# Patient Record
Sex: Male | Born: 1940 | Race: White | Hispanic: No | Marital: Married | Smoking: Never smoker
Health system: Southern US, Community
[De-identification: ages and names within clinical notes are randomized; demographics above are authoritative.]

## PROBLEM LIST (undated history)

## (undated) DIAGNOSIS — N400 Enlarged prostate without lower urinary tract symptoms: Secondary | ICD-10-CM

## (undated) DIAGNOSIS — E039 Hypothyroidism, unspecified: Secondary | ICD-10-CM

## (undated) DIAGNOSIS — I259 Chronic ischemic heart disease, unspecified: Secondary | ICD-10-CM

## (undated) DIAGNOSIS — E538 Deficiency of other specified B group vitamins: Secondary | ICD-10-CM

## (undated) DIAGNOSIS — M810 Age-related osteoporosis without current pathological fracture: Secondary | ICD-10-CM

## (undated) DIAGNOSIS — E785 Hyperlipidemia, unspecified: Secondary | ICD-10-CM

## (undated) DIAGNOSIS — M1990 Unspecified osteoarthritis, unspecified site: Secondary | ICD-10-CM

## (undated) DIAGNOSIS — R55 Syncope and collapse: Secondary | ICD-10-CM

## (undated) DIAGNOSIS — I2510 Atherosclerotic heart disease of native coronary artery without angina pectoris: Secondary | ICD-10-CM

## (undated) DIAGNOSIS — E559 Vitamin D deficiency, unspecified: Secondary | ICD-10-CM

## (undated) HISTORY — PX: CATARACT EXTRACTION, BILATERAL: SHX1313

## (undated) HISTORY — DX: Deficiency of other specified B group vitamins: E53.8

## (undated) HISTORY — DX: Syncope and collapse: R55

## (undated) HISTORY — PX: WISDOM TOOTH EXTRACTION: SHX21

## (undated) HISTORY — DX: Vitamin D deficiency, unspecified: E55.9

## (undated) HISTORY — PX: LEG SURGERY: SHX1003

## (undated) HISTORY — DX: Hyperlipidemia, unspecified: E78.5

## (undated) HISTORY — PX: CARDIAC CATHETERIZATION: SHX172

## (undated) HISTORY — DX: Benign prostatic hyperplasia without lower urinary tract symptoms: N40.0

## (undated) HISTORY — DX: Unspecified osteoarthritis, unspecified site: M19.90

## (undated) HISTORY — DX: Chronic ischemic heart disease, unspecified: I25.9

## (undated) HISTORY — DX: Hypothyroidism, unspecified: E03.9

## (undated) HISTORY — PX: TONSILLECTOMY: SUR1361

---

## 2008-05-22 DIAGNOSIS — J301 Allergic rhinitis due to pollen: Secondary | ICD-10-CM | POA: Insufficient documentation

## 2008-05-22 DIAGNOSIS — E538 Deficiency of other specified B group vitamins: Secondary | ICD-10-CM | POA: Insufficient documentation

## 2011-04-08 DIAGNOSIS — H04129 Dry eye syndrome of unspecified lacrimal gland: Secondary | ICD-10-CM | POA: Insufficient documentation

## 2016-06-08 DIAGNOSIS — I10 Essential (primary) hypertension: Secondary | ICD-10-CM | POA: Insufficient documentation

## 2016-06-09 ENCOUNTER — Encounter: Payer: Self-pay | Admitting: Neurology

## 2016-06-10 ENCOUNTER — Encounter: Payer: Self-pay | Admitting: Neurology

## 2016-07-22 ENCOUNTER — Encounter: Payer: Self-pay | Admitting: Neurology

## 2016-07-23 NOTE — Progress Notes (Signed)
Colin Barker was seen today in the movement disorders clinic for neurologic consultation at the request of GARLICK,WILLIAM, MD.  The consultation is for the evaluation of tremor.  This patient is accompanied in the office by his wife who supplements the history.   Tremor: Yes.     How long has it been going on? 2 years, getting worse  At rest or with activation?  With use  Fam hx of tremor?  Yes.  , sisters (2) and brother have tremor  Located where?  Started in the L hand but is L hand dominant but has in the right now  Affected by caffeine:  No. (diet coke - 3 cans/day; unsweet tea with meal - 2 glasses)  Affected by alcohol:  No. (3-4 beers/week; used to be heavy drinker but stopped that 3-4 years ago)  Affected by stress:  No.  Affected by fatigue:  Yes.    Spills soup if on spoon:  yes  Spills glass of liquid if full:  Yes.    Affects ADL's (tying shoes, brushing teeth, etc):  Yes.   (has to brush teeth and use razor with both hands   Specific Symptoms:  Voice: no change Sleep: some intermittent trouble staying asleep  Vivid Dreams:  No.  Acting out dreams:  No. Wet Pillows: No. Postural symptoms:  Yes.  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  No. (none in recent years) Bradykinesia symptoms: difficulty getting out of a chair (has to push off) Loss of smell:  Yes.   Loss of taste:  No. Urinary Incontinence:  No. Difficulty Swallowing:  No. Handwriting, micrographia: No. Depression:  No. Memory changes:  No. visual distortions: Yes.   but rarely N/V:  No. Lightheaded:  No.  Syncope: No. Diplopia:  No. Dyskinesia:  No.  Neuroimaging has not previously been performed.    PREVIOUS MEDICATIONS: metoprolol 75 mg started one year ago but no help with tremor.  Started on it by cardiology but for tremor per patient  ALLERGIES:   Allergies  Allergen Reactions  .  Rosuvastatin     Other reaction(s): MUSCLE PAIN  . Statins     CURRENT MEDICATIONS:  Outpatient Encounter Prescriptions as of 07/28/2016  Medication Sig  . acetaminophen (TYLENOL) 500 MG tablet Take 500 mg by mouth every 6 (six) hours as needed.  Marland Kitchen aspirin EC 81 MG tablet Take 81 mg by mouth daily.  . cholecalciferol (VITAMIN D) 1000 units tablet Take 2,000 Units by mouth daily.   . clopidogrel (PLAVIX) 75 MG tablet Take 75 mg by mouth daily.  . isosorbide mononitrate (IMDUR) 30 MG 24 hr tablet Take 30 mg by mouth daily.  Marland Kitchen levothyroxine (SYNTHROID, LEVOTHROID) 100 MCG tablet Take 100 mcg by mouth daily before breakfast.  . metoprolol succinate (TOPROL-XL) 25 MG 24 hr tablet Take 25 mg by mouth daily.  . simvastatin (ZOCOR) 40 MG tablet Take 40 mg by mouth daily.  . vitamin B-12 (CYANOCOBALAMIN) 1000 MCG tablet Take 1,000 mcg by mouth daily.  . [DISCONTINUED] ibuprofen (ADVIL,MOTRIN) 800 MG tablet Take 800 mg by mouth every 8 (eight) hours as needed.  . [DISCONTINUED] HYDROcodone-acetaminophen (NORCO) 7.5-325 MG tablet Take 1 tablet by mouth every 6 (six) hours as needed for moderate pain.   No facility-administered encounter medications on file as of 07/28/2016.     PAST MEDICAL HISTORY:  Past Medical History:  Diagnosis Date  . BPH (benign prostatic hyperplasia)   . Chronic ischemic heart disease   . Hyperlipidemia   . Hypothyroidism   . Osteoarthritis   . Syncope   . Vitamin B12 deficiency    on injections  . Vitamin D deficiency     PAST SURGICAL HISTORY:   Past Surgical History:  Procedure Laterality Date  . CATARACT EXTRACTION, BILATERAL    . LEG SURGERY Right    x3    SOCIAL HISTORY:   Social History   Social History  . Marital status: Married    Spouse name: N/A  . Number of children: N/A  . Years of education: N/A   Occupational History  . retired     Stage manager   Social History Main Topics  . Smoking  status: Never Smoker  . Smokeless tobacco: Current User    Types: Chew  . Alcohol use Yes     Comment: 3-4 a week (hx of heavier use)  . Drug use: No  . Sexual activity: Not on file   Other Topics Concern  . Not on file   Social History Narrative  . No narrative on file    FAMILY HISTORY:   Family Status  Relation Status  . Mother Deceased  . Father Deceased  . Sister Alive  . Brother Alive  . Child Alive  . Sister Deceased    ROS:  Admits to neck and low back pain.  Was told in the past that he has compression fractures in the low back, but not a candidate for surgery because of osteopenia.  A complete 10 system review of systems was obtained and was unremarkable apart from what is mentioned above.  PHYSICAL EXAMINATION:    VITALS:   Vitals:   07/28/16 0935  BP: 138/84  Pulse: (!) 52  Weight: 260 lb (117.9 kg)  Height: 6' (1.829 m)    GEN:  The patient appears stated age and is in NAD. HEENT:  Normocephalic, atraumatic.  The mucous membranes are moist. The superficial temporal arteries are without ropiness or tenderness. CV:  Bradycardic.  Regular. Lungs:  CTAB Neck/HEME:  There are no carotid bruits bilaterally.  Neurological examination:  Orientation: The patient is alert and oriented x3. Fund of knowledge is appropriate.  Recent and remote memory are intact.  Attention and concentration are normal.    Able to name objects and repeat phrases. Cranial nerves: There is good facial symmetry. Pupils are equal round and reactive to light bilaterally. Fundoscopic exam reveals clear margins bilaterally. Extraocular muscles are intact. The visual fields are full to confrontational testing. The speech is fluent and clear. Soft palate rises symmetrically and there is no tongue deviation. Hearing is intact to conversational tone. Sensation: Sensation is intact to light and pinprick throughout (facial, trunk, extremities). Vibration is decreased distially. There is no  extinction with double simultaneous stimulation. There is no sensory dermatomal level identified. Motor: Strength is 5/5 in the bilateral upper and lower extremities.   Shoulder shrug is equal and symmetric.  There is no pronator drift. Deep tendon reflexes: Deep tendon reflexes are 3+/4 at the bilateral biceps, triceps, brachioradialis, patella (With cross adductor reflexes) and 1/4 at the bilateral achilles.  He has pectoralis reflexes bilaterally.  Plantar responses are downgoing bilaterally.  Movement examination: Tone: There is mild increased tone in the RUE.  Abnormal movements: There is a RUE resting tremor more than LUE resting tremor but  both are present.  He has mild tremor of outstretched hands that only slightly increase with intention.  When given a weight in the hand, it did not change tremor significantly.  When asked to draw Archimedes spirals, he does have difficulty getting the pen on the paper  but then is able to draw the spirals with evidence of tremor.  Tremor is evident when asked to pour water from one glass to another, but he does not really spill the water. Coordination:  There is decremation with RAM's, with any form of RAMS, including alternating supination and pronation of the forearm, hand opening and closing, finger taps, heel taps and toe taps on the right.   Gait and Station: The patient has difficulty arising out of a deep-seated chair without the use of the hands.  He is able to do it on the second attempt, although he almost falls back into the chair.  The patient's stride length is normal with no evidence of re-emergent tremor.  He is stooped.  He is just mildly slow.  He is antalgic because of a short leg from previous surgeries.  Labs: Patient had lab work on 06/30/2016.  I reviewed those.  His white blood cells were 5.6, hemoglobin 13.2, hematocrit 39.2 and platelets 175.  Sodium was 142, potassium 4.3, chloride 103, CO2 21, BUN 20, creatinine 1.04 and glucose 100.   AST 18, ALT 14 and alkaline phosphatase 57.  TSH was 2.770.  ASSESSMENT/PLAN:  1.  Tremor  -The patient does have some features on his examination that are concerning for a parkinsonian syndrome, although his history sounds more consistent with essential tremor.  It is true that essential tremor can present asymmetrically and alternately cause rest tremor, but he has not had this all that long and I would be a little surprised to see rest tremor this early.  We will go ahead and schedule a DaT scan.  -Patient asked me if he could discontinue his metoprolol.  I told him that was certainly up to his prescribing physician, but if it was purely for tremor as he states it is, I have no objection to the discontinuation since he does not think it helps.  He is bradycardic, likely due to this medication.  2.  Neck pain with significant hyperreflexia.  -We will proceed with an MRI of the brain and cervical spine.  3.  I will follow-up with him after the above have been completed.  Greater than 50% of the 45 minute visit was in counseling/coordinating care.    CcNicolasa Ducking:  GARLICK,WILLIAM, MD

## 2016-07-28 ENCOUNTER — Encounter: Payer: Self-pay | Admitting: Neurology

## 2016-07-28 ENCOUNTER — Ambulatory Visit (INDEPENDENT_AMBULATORY_CARE_PROVIDER_SITE_OTHER): Payer: Medicare HMO | Admitting: Neurology

## 2016-07-28 VITALS — BP 138/84 | HR 52 | Ht 72.0 in | Wt 260.0 lb

## 2016-07-28 DIAGNOSIS — R292 Abnormal reflex: Secondary | ICD-10-CM

## 2016-07-28 DIAGNOSIS — R251 Tremor, unspecified: Secondary | ICD-10-CM

## 2016-07-28 DIAGNOSIS — M542 Cervicalgia: Secondary | ICD-10-CM | POA: Diagnosis not present

## 2016-07-28 DIAGNOSIS — R2681 Unsteadiness on feet: Secondary | ICD-10-CM

## 2016-07-28 NOTE — Patient Instructions (Signed)
1. We will call you to schedule Dat Scan and MRI Brain/Cervical Spine once [facility] starts offering Dat Scanning (which should be soon).

## 2016-08-24 ENCOUNTER — Telehealth: Payer: Self-pay | Admitting: Neurology

## 2016-08-24 DIAGNOSIS — R251 Tremor, unspecified: Secondary | ICD-10-CM

## 2016-08-24 DIAGNOSIS — R292 Abnormal reflex: Secondary | ICD-10-CM

## 2016-08-24 DIAGNOSIS — M542 Cervicalgia: Secondary | ICD-10-CM

## 2016-08-24 NOTE — Telephone Encounter (Signed)
Caller: PT's Wife  Urgent? No  Reason for the call: She states that she has not heard anything about scheduling his scan

## 2016-08-25 NOTE — Telephone Encounter (Signed)
Spoke with patient's wife. Made aware DAT will be available at the end of May. They want to go ahead and schedule MR's while they are waiting. Order entered. [facility] will call them to schedule.

## 2016-09-02 ENCOUNTER — Telehealth: Payer: Self-pay | Admitting: Neurology

## 2016-09-02 NOTE — Telephone Encounter (Signed)
Left message on machine for patient's wife to call back.  To make her aware we did get denial on scans. Will need to appeal, so they should cancel MR's for now until we have time to get approval. Awaiting call back.

## 2016-09-02 NOTE — Telephone Encounter (Signed)
Caller: Patient's Wife Colin Mortimer(Wanda)   Urgent? Yes  Reason for the call: Colin Barker is scheduled for an MRI on Wednesday 09/09/16. She said insurance has denied it and she confirmed they do have [employer] and that [employer] needs more Clinical Information. Thanks

## 2016-09-02 NOTE — Telephone Encounter (Signed)
Received denial from [employer] Pouch([employer]) for patient to have MR Brain and MR Cervical Spine. We can appeal at phone number [phone] reference #[id number]. Please advise.

## 2016-09-02 NOTE — Telephone Encounter (Signed)
Patient's wife made aware.

## 2016-09-03 NOTE — Telephone Encounter (Signed)
Ive called this number along with several others they have directed me to and cannot get anyone to speak to me about this case/denial.

## 2016-09-07 ENCOUNTER — Telehealth: Payer: Self-pay | Admitting: Neurology

## 2016-09-07 NOTE — Telephone Encounter (Signed)
Spoke with patient's wife. Made her aware we are working on appealing decision.   Spoke with [employer] and they state either the MD or patient has to call to request the expedited appeal. [phone]. Then we can fax in clinicals to [phone]. Turn around time is 3 days. Regular appeal is 60 days.

## 2016-09-07 NOTE — Telephone Encounter (Signed)
Caller:  Aquan's wife Burna Mortimer(Wanda)   Urgent? Yes  Reason for the call: She is needing to know about the MRI that was cancelled and if they could just pay for it so he can have it done. She would like you to please call her. She was also asking if there was another location. Please call. Thanks

## 2016-09-07 NOTE — Telephone Encounter (Signed)
error 

## 2016-09-08 NOTE — Telephone Encounter (Signed)
Patient's wife made aware.

## 2016-09-09 ENCOUNTER — Other Ambulatory Visit: Payer: Medicare HMO

## 2016-09-09 ENCOUNTER — Encounter: Payer: Self-pay | Admitting: Neurology

## 2016-09-09 ENCOUNTER — Telehealth: Payer: Self-pay | Admitting: Neurology

## 2016-09-09 DIAGNOSIS — R251 Tremor, unspecified: Secondary | ICD-10-CM

## 2016-09-09 NOTE — Telephone Encounter (Signed)
[facility] and [facility]. [phone] with confirmation received.   Appeal letter also sent to [facility] at [phone] for clinical ref [id number]. MR Cervical Spine. Awaiting decision.

## 2016-09-09 NOTE — Telephone Encounter (Signed)
DAT Scan order entered. Message sent to April Pait to schedule. No auth needed for [id number] or [id number] per Alvino ChapelEllen at [employer].   LMOM making wife aware.

## 2016-09-09 NOTE — Telephone Encounter (Signed)
Caller: [employer]  Urgent?   Reason for the call: Prior authorization for PT/CB# [id number] OPT#4,2 [phone]#[id number]

## 2016-09-11 ENCOUNTER — Telehealth: Payer: Self-pay | Admitting: Neurology

## 2016-09-11 NOTE — Telephone Encounter (Signed)
Caller: Burna MortimerWanda  Urgent? No  Reason for the call: VM-Left a message to have a call back from [place] and did not say why

## 2016-09-11 NOTE — Telephone Encounter (Signed)
She states they received a call [employer] approved MR. I let her know she could go ahead and [facility].

## 2016-09-15 ENCOUNTER — Encounter (HOSPITAL_COMMUNITY)
Admission: RE | Admit: 2016-09-15 | Discharge: 2016-09-15 | Disposition: A | Discharge status: Home / Self Care | Payer: Medicare HMO | Source: Ambulatory Visit | Attending: Neurology | Admitting: Neurology

## 2016-09-15 ENCOUNTER — Encounter (HOSPITAL_COMMUNITY): Payer: Medicare HMO

## 2016-09-15 DIAGNOSIS — R251 Tremor, unspecified: Secondary | ICD-10-CM | POA: Insufficient documentation

## 2016-09-15 MED ORDER — IOFLUPANE I 123 185 MBQ/2.5ML IV SOLN
4.5900 | Freq: Once | INTRAVENOUS | Status: AC
  Administered 2016-09-15: 4.59 via INTRAVENOUS

## 2016-09-15 NOTE — Progress Notes (Addendum)
Colin Barker was seen today in the movement disorders clinic for neurologic consultation at the request of Colin Barker, William, MD.  The consultation is for the evaluation of tremor.  This patient is accompanied in the office by his wife who supplements the history.   Tremor: Yes.     How long has it been going on? 2 years, getting worse  At rest or with activation?  With use  Fam hx of tremor?  Yes.  , sisters (2) and brother have tremor  Located where?  Started in the L hand but is L hand dominant but has in the right now  Affected by caffeine:  No. (diet coke - 3 cans/day; unsweet tea with meal - 2 glasses)  Affected by alcohol:  No. (3-4 beers/week; used to be heavy drinker but stopped that 3-4 years ago)  Affected by stress:  No.  Affected by fatigue:  Yes.    Spills soup if on spoon:  yes  Spills glass of liquid if full:  Yes.    Affects ADL's (tying shoes, brushing teeth, etc):  Yes.   (has to brush teeth and use razor with both hands   Specific Symptoms:  Voice: no change Sleep: some intermittent trouble staying asleep  Vivid Dreams:  No.  Acting out dreams:  No. Wet Pillows: No. Postural symptoms:  Yes.  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  No. (none in recent years) Bradykinesia symptoms: difficulty getting out of a chair (has to push off) Loss of smell:  Yes.   Loss of taste:  No. Urinary Incontinence:  No. Difficulty Swallowing:  No. Handwriting, micrographia: No. Depression:  No. Memory changes:  No. visual distortions: Yes.   but rarely N/V:  No. Lightheaded:  No.  Syncope: No. Diplopia:  No. Dyskinesia:  No.  09/17/16 update:  Patient seen today in follow-up.  He is accompanied by his wife who supplements the history.  I ordered an MRI of the brain and cervical spine since our last visit.  They were initially denied and had to have a written appeal.   MRI of the cervical spine is subsequently scheduled for 09/20/2016 and MRI of the brain is still being appealed (currently still denied by insurance).  DaT scan was done on 09/15/2016.  This demonstrated normal uptake of the radiotracer.  I personally reviewed the films.    PREVIOUS MEDICATIONS: metoprolol 75 mg started one year ago but no help with tremor.  Started on it by cardiology but for tremor per patient  ALLERGIES:   Allergies  Allergen Reactions  . Rosuvastatin     Other reaction(s): MUSCLE PAIN  . Statins     CURRENT MEDICATIONS:  Outpatient Encounter Prescriptions as of 09/17/2016  Medication Sig  . acetaminophen (TYLENOL) 500 MG tablet Take 500 mg by mouth every 6 (six) hours as needed.  Marland Kitchen. aspirin EC 81 MG tablet Take 81 mg by mouth daily.  . cholecalciferol (VITAMIN D) 1000 units tablet Take 2,000 Units by mouth daily.   . clopidogrel (PLAVIX) 75 MG tablet Take 75 mg by mouth daily.  . isosorbide mononitrate (IMDUR) 30 MG 24 hr tablet Take 30 mg by mouth daily.  Marland Kitchen. levothyroxine (SYNTHROID, LEVOTHROID) 100 MCG tablet Take 100 mcg by mouth daily before breakfast.  . simvastatin (ZOCOR) 40 MG tablet Take 40 mg by mouth daily.  . vitamin  B-12 (CYANOCOBALAMIN) 1000 MCG tablet Take 1,000 mcg by mouth daily.  . [DISCONTINUED] metoprolol succinate (TOPROL-XL) 25 MG 24 hr tablet Take 25 mg by mouth daily.   No facility-administered encounter medications on file as of 09/17/2016.     PAST MEDICAL HISTORY:   Past Medical History:  Diagnosis Date  . BPH (benign prostatic hyperplasia)   . Chronic ischemic heart disease   . Hyperlipidemia   . Hypothyroidism   . Osteoarthritis   . Syncope   . Vitamin B12 deficiency    on injections  . Vitamin D deficiency     PAST SURGICAL HISTORY:   Past Surgical History:  Procedure Laterality Date  . CATARACT EXTRACTION, BILATERAL    . LEG SURGERY Right    x3    SOCIAL HISTORY:   Social History   Social History  . Marital status:  Married    Spouse name: N/A  . Number of children: N/A  . Years of education: N/A   Occupational History  . retired     Stage manager   Social History Main Topics  . Smoking status: Never Smoker  . Smokeless tobacco: Current User    Types: Chew  . Alcohol use Yes     Comment: 3-4 a week (hx of heavier use)  . Drug use: No  . Sexual activity: Not on file   Other Topics Concern  . Not on file   Social History Narrative  . No narrative on file    FAMILY HISTORY:   Family Status  Relation Status  . Mother Deceased  . Father Deceased  . Sister Alive  . Brother Alive  . Child Alive  . Sister Deceased    ROS:  Admits to neck and low back pain.  Was told in the past that he has compression fractures in the low back, but not a candidate for surgery because of osteopenia.  A complete 10 system review of systems was obtained and was unremarkable apart from what is mentioned above.  PHYSICAL EXAMINATION:    VITALS:   Vitals:   09/17/16 1101  BP: 134/68  Pulse: 71  SpO2: 95%  Weight: 260 lb (117.9 kg)  Height: 6' (1.829 m)    GEN:  The patient appears stated age and is in NAD. HEENT:  Normocephalic, atraumatic.  The mucous membranes are moist. The superficial temporal arteries are without ropiness or tenderness. CV:  Bradycardic.  Regular. Lungs:  CTAB Neck/HEME:  There are no carotid bruits bilaterally.  Neurological examination:  Orientation: The patient is alert and oriented x3.  Cranial nerves: There is good facial symmetry.  The speech is fluent and clear. Soft palate rises symmetrically and there is no tongue deviation. Hearing is intact to conversational tone. Sensation: Sensation is intact to light and pinprick throughout (facial, trunk, extremities). Vibration is decreased distially. There is no extinction with double simultaneous stimulation. There is no sensory dermatomal level identified. Motor: Strength is 5/5 in  the bilateral upper and lower extremities.   Shoulder shrug is equal and symmetric.  There is no pronator drift. Deep tendon reflexes: Deep tendon reflexes are 3+/4 at the bilateral biceps, triceps, brachioradialis, patella (With cross adductor reflexes) and 1/4 at the bilateral achilles.  He has pectoralis reflexes bilaterally.  Plantar responses are downgoing bilaterally.  Movement examination: Tone: There is mild increased tone in the RUE.  Abnormal movements: no resting tremor with either hand today  He has mild tremor of outstretched hands  that only slightly increase with intention.  When given a weight in the hand, it did not change tremor significantly.  .  Tremor is evident when asked to pour water from one glass to another, but he does not really spill the water. Coordination:  There is decremation with RAM's, with any form of RAMS, including alternating supination and pronation of the forearm, hand opening and closing, finger taps, heel taps and toe taps on the right.   Gait and Station: The patient has mild difficulty arising out of a deep-seated chair without the use of the hands.  .  The patient's stride length is normal with no evidence of re-emergent tremor.  He is stooped.  He is just mildly slow.  He is antalgic because of a short leg from previous surgeries.  Labs: Patient had lab work on 06/30/2016.  I reviewed those.  His white blood cells were 5.6, hemoglobin 13.2, hematocrit 39.2 and platelets 175.  Sodium was 142, potassium 4.3, chloride 103, CO2 21, BUN 20, creatinine 1.04 and glucose 100.  AST 18, ALT 14 and alkaline phosphatase 57.  TSH was 2.770.  ASSESSMENT/PLAN:  1.  Tremor  -DaT scan was negative but he still has some rigidity in the RUE.  Hx is more c/w ET.  Pt/wife shows pics of the DaT scan today and it was reviewed with them.  -will start primidone - 50 mg.  Not a lot of tremor seen today but pt does state that it gets worse as day goes on.  Risks, benefits, side  effects and alternative therapies were discussed.  The opportunity to ask questions was given and they were answered to the best of my ability.  The patient expressed understanding and willingness to follow the outlined treatment protocols.  -insurance has denied MRI brain, which is frustrating given I would not expect ET to come on quite so fast and produce unilateral tremor, even though rest of hx is consisten with essential tremor.  We have appealed the MRI brain but at this point it is still denied.  Wife going to call insurance again which is how MRI cervical spine got approved  -Patient feels that d/c metoprolol actually made tremor better  2.  Neck pain with significant hyperreflexia.  -cervical spine MRI scheduled for 09/20/16  3.  [place] so will call him with cervical spine results and f/u with him in a few months.  Much greater than 50% of this visit was spent in counseling and coordinating care.  Total face to face time:  35 min     Cc:  Colin Ducking, MD

## 2016-09-17 ENCOUNTER — Ambulatory Visit (INDEPENDENT_AMBULATORY_CARE_PROVIDER_SITE_OTHER): Payer: Medicare HMO | Admitting: Neurology

## 2016-09-17 ENCOUNTER — Encounter: Payer: Self-pay | Admitting: Neurology

## 2016-09-17 VITALS — BP 134/68 | HR 71 | Ht 72.0 in | Wt 260.0 lb

## 2016-09-17 DIAGNOSIS — R251 Tremor, unspecified: Secondary | ICD-10-CM

## 2016-09-17 MED ORDER — PRIMIDONE 50 MG PO TABS: 50.0000 mg | ORAL_TABLET | Freq: Every day | ORAL | 1 refills | Status: DC

## 2016-09-17 MED ORDER — PRIMIDONE 50 MG PO TABS: 50.0000 mg | ORAL_TABLET | Freq: Every day | ORAL | 0 refills | Status: DC

## 2016-09-17 NOTE — Patient Instructions (Signed)
1.  Start primidone - 50 mg - 1/2 tablet at night for 4 nights and then increase to 1 tablet at night 2.  Let me know if you call insurance and they say that they still don't have order for the MRI brain. 3.  We will call you with MRI cervical spine results 4.  Make your follow up for 3-4 months! 5.  Good to see you!  Have a great summer!

## 2016-09-20 ENCOUNTER — Ambulatory Visit
Admission: RE | Admit: 2016-09-20 | Discharge: 2016-09-20 | Disposition: A | Discharge status: Home / Self Care | Payer: Medicare HMO | Source: Ambulatory Visit | Attending: Neurology | Admitting: Neurology

## 2016-09-20 DIAGNOSIS — M542 Cervicalgia: Secondary | ICD-10-CM

## 2016-09-20 DIAGNOSIS — R292 Abnormal reflex: Secondary | ICD-10-CM

## 2016-09-20 DIAGNOSIS — R251 Tremor, unspecified: Secondary | ICD-10-CM

## 2016-09-22 ENCOUNTER — Telehealth: Payer: Self-pay | Admitting: Neurology

## 2016-09-22 NOTE — Telephone Encounter (Signed)
Patient's wife made aware. Referral faxed to [facility] at [phone] with confirmation received. They will contact the patient to schedule.

## 2016-09-22 NOTE — Telephone Encounter (Signed)
Left message on machine for patient to call back.

## 2016-09-22 NOTE — Telephone Encounter (Signed)
-----   Message from Octaviano Battyebecca S Tat, DO sent at 09/22/2016  7:31 AM EDT ----- Reviewed.  Significant CCS at C3-4.  Marshaun Lortie, let patient know results.  I would recommend neurosx referral with Dr. Venetia MaxonStern

## 2016-09-24 ENCOUNTER — Telehealth: Payer: Self-pay | Admitting: Neurology

## 2016-09-24 MED ORDER — PRIMIDONE 50 MG PO TABS: 50.0000 mg | ORAL_TABLET | Freq: Every day | ORAL | 0 refills | Status: DC

## 2016-09-24 NOTE — Telephone Encounter (Signed)
Caller: Remer MachoWanda Haymaker (wife)  Urgent? No  Reason for the call: They will be going out of town for 2 months and he will need a 3 month supply of his Primidone.  He received a 30 day last time. She said she had her Insurance company to do  a [facility] [facility] [place]. Thanks

## 2016-09-24 NOTE — Telephone Encounter (Signed)
90 day supply sent to pharmacy

## 2016-11-05 ENCOUNTER — Telehealth: Payer: Self-pay | Admitting: Neurology

## 2016-11-05 NOTE — Telephone Encounter (Signed)
[facility]. To call with any questions.

## 2016-11-05 NOTE — Telephone Encounter (Signed)
Patient wife called and needs to talk to someone about medication concerns and possibly moving appt up

## 2016-11-05 NOTE — Telephone Encounter (Signed)
Spoke with patient's wife. She states that patient is still having trouble with tremors. Currently on Primidone 50 mg - 1 tablet at bedtime. No current side effects. He is doing a little better on medication, but not much.  He has cut out alcohol and almost all caffeine.  Next appt in September. Please advise.

## 2016-11-05 NOTE — Telephone Encounter (Signed)
Increase to 50 mg bid. 

## 2016-11-17 ENCOUNTER — Telehealth: Payer: Self-pay | Admitting: Neurology

## 2016-11-17 MED ORDER — PRIMIDONE 50 MG PO TABS: 50.0000 mg | ORAL_TABLET | Freq: Two times a day (BID) | ORAL | 0 refills | Status: DC

## 2016-11-17 NOTE — Telephone Encounter (Signed)
Caller: [place]  Urgent? No  Reason for the call: Regarding needing his Primidone 50 MG [facility]. He needs a 90 day supply. She said since he was increased to 2 pills his tremors have gotten a little better. Thanks

## 2016-11-17 NOTE — Telephone Encounter (Signed)
RX sent to pharmacy  

## 2016-12-07 ENCOUNTER — Other Ambulatory Visit: Payer: Self-pay | Admitting: Neurosurgery

## 2016-12-25 NOTE — Progress Notes (Signed)
Colin Barker was seen today in the movement disorders clinic for neurologic consultation at the request of Nicolasa Ducking, MD.  The consultation is for the evaluation of tremor.  This patient is accompanied in the office by his wife who supplements the history.   Tremor: Yes.     How long has it been going on? 2 years, getting worse  At rest or with activation?  With use  Fam hx of tremor?  Yes.  , sisters (2) and brother have tremor  Located where?  Started in the L hand but is L hand dominant but has in the right now  Affected by caffeine:  No. (diet coke - 3 cans/day; unsweet tea with meal - 2 glasses)  Affected by alcohol:  No. (3-4 beers/week; used to be heavy drinker but stopped that 3-4 years ago)  Affected by stress:  No.  Affected by fatigue:  Yes.    Spills soup if on spoon:  yes  Spills glass of liquid if full:  Yes.    Affects ADL's (tying shoes, brushing teeth, etc):  Yes.   (has to brush teeth and use razor with both hands   Specific Symptoms:  Voice: no change Sleep: some intermittent trouble staying asleep  Vivid Dreams:  No.  Acting out dreams:  No. Wet Pillows: No. Postural symptoms:  Yes.  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  No. (none in recent years) Bradykinesia symptoms: difficulty getting out of a chair (has to push off) Loss of smell:  Yes.   Loss of taste:  No. Urinary Incontinence:  No. Difficulty Swallowing:  No. Handwriting, micrographia: No. Depression:  No. Memory changes:  No. visual distortions: Yes.   but rarely N/V:  No. Lightheaded:  No.  Syncope: No. Diplopia:  No. Dyskinesia:  No.  09/17/16 update:  Patient seen today in follow-up.  He is accompanied by his wife who supplements the history.  I ordered an MRI of the brain and cervical spine since our last visit.  They were initially denied and had to have a written appeal.   MRI of the cervical spine is subsequently scheduled for 09/20/2016 and MRI of the brain is still being appealed (currently still denied by insurance).  DaT scan was done on 09/15/2016.  This demonstrated normal uptake of the radiotracer.  I personally reviewed the films.    12/29/16 update: Patient seen today in follow-up for essential tremor.  He was started on primidone last visit.  His wife called in July and stated that the medication was helping, but not enough.  The medication was increased to 50 mg, twice daily dosing.  The medication is helpful but he still has trouble shaving.  He has cut out alcohol and has backed down on coke.  He had an MRI of the cervical spine since our last visit.  I ordered this and reviewed it when completed.  There was disc protrusion at the C 3 C4 level with associated cord myelomalacia.  He is scheduled to have cervical spine surgery on 01/05/2017 with Dr. Venetia Maxon.  PREVIOUS MEDICATIONS: metoprolol 75 mg started one year ago but no help with tremor.  Started on it by cardiology but for tremor per patient  ALLERGIES:   Allergies  Allergen Reactions  . Rosuvastatin     Other reaction(s): MUSCLE PAIN    CURRENT MEDICATIONS:  Outpatient Encounter Prescriptions as of 12/29/2016  Medication Sig  . acetaminophen (TYLENOL) 500 MG tablet Take 1,000 mg by mouth every 6 (six) hours as needed for mild pain.   Marland Kitchen. aspirin EC 81 MG tablet Take 81 mg by mouth at bedtime.   . cetirizine (ZYRTEC) 10 MG tablet Take 10 mg by mouth daily.  . cholecalciferol (VITAMIN D) 1000 units tablet Take 2,000 Units by mouth at bedtime.   . clopidogrel (PLAVIX) 75 MG tablet Take 75 mg by mouth at bedtime.   . cyanocobalamin (,VITAMIN B-12,) 1000 MCG/ML injection Inject 1,000 mcg into the skin every 30 (thirty) days.  . isosorbide mononitrate (IMDUR) 30 MG 24 hr tablet Take 30 mg by mouth daily.  Marland Kitchen. levothyroxine (SYNTHROID, LEVOTHROID) 100 MCG tablet Take 50-100 mcg by mouth daily before breakfast.  Takes 50mcg daily Mon-Fri and 100mcg daily on Sat and Sun only.  Marland Kitchen. omeprazole (PRILOSEC) 40 MG capsule Take 40 mg by mouth daily before breakfast.  . primidone (MYSOLINE) 50 MG tablet Take 1 tablet (50 mg total) by mouth 2 (two) times daily.  . simvastatin (ZOCOR) 40 MG tablet Take 40 mg by mouth at bedtime.    No facility-administered encounter medications on file as of 12/29/2016.     PAST MEDICAL HISTORY:   Past Medical History:  Diagnosis Date  . BPH (benign prostatic hyperplasia)   . Chronic ischemic heart disease   . Coronary artery disease   . Hyperlipidemia   . Hypothyroidism   . Osteoarthritis   . Osteoarthritis   . Osteoporosis   . Syncope   . Vitamin B12 deficiency    on injections  . Vitamin D deficiency     PAST SURGICAL HISTORY:   Past Surgical History:  Procedure Laterality Date  . CARDIAC CATHETERIZATION    . CATARACT EXTRACTION, BILATERAL    . LEG SURGERY Right    x3  . TONSILLECTOMY    . WISDOM TOOTH EXTRACTION      SOCIAL HISTORY:   Social History   Social History  . Marital status: Married    Spouse name: N/A  . Number of children: N/A  . Years of education: N/A   Occupational History  . 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 Smoking status: Never Smoker  . Smokeless tobacco: Current User    Types: Chew  . Alcohol use No     Comment: 3-4 a week (hx of heavier use)  . Drug use: No  . Sexual activity: Not on file   Other Topics Concern  . Not on file   Social History Narrative  . No narrative on file    FAMILY HISTORY:   Family Status  Relation Status  . Mother Deceased  . Father Deceased  . Sister Alive  . Brother Alive  . Child Alive  . Sister Deceased    ROS:   A complete 10 system review of systems was obtained and was unremarkable apart from what is mentioned above.  PHYSICAL EXAMINATION:    VITALS:   Vitals:   12/29/16 1416  BP: 122/72  Pulse: 68    SpO2: 95%  Weight: 249 lb (112.9 kg)  Height: 6' (1.829 m)    GEN:  The patient appears stated age and is in NAD. HEENT:  Normocephalic, atraumatic.  The mucous membranes are moist. The superficial temporal arteries are without ropiness or tenderness. CV:  Bradycardic.  Regular. Lungs:  CTAB Neck/HEME:  There are no carotid bruits bilaterally.  Neurological examination:  Orientation: The patient is alert and oriented x3.  Cranial nerves: There is good facial symmetry.  The speech is fluent and clear. Soft palate rises symmetrically and there is no tongue deviation. Hearing is intact to conversational tone. Sensation: Sensation is intact to light and pinprick throughout (facial, trunk, extremities). Vibration is decreased distially. There is no extinction with double simultaneous stimulation. There is no sensory dermatomal level identified. Motor: Strength is 5/5 in the bilateral upper and lower extremities.  Grip strength is markedly decreased bilaterally.     Shoulder shrug is equal and symmetric.  There is no pronator drift. Deep tendon reflexes: Deep tendon reflexes are 3+/4 at the bilateral biceps, triceps, brachioradialis, patella (With cross adductor reflexes) and 1/4 at the bilateral achilles.  He has pectoralis reflexes bilaterally.  Plantar responses are downgoing bilaterally.  Movement examination: Tone: There is good tone today Abnormal movements: no resting tremor with either hand today  He has mild tremor of outstretched hands that only slightly increase with intention.  When given a weight in the hand, it did not change tremor significantly.  .  Tremor is evident when pouring water but he doesn't spill it.  Archimedes spirals are improved.   Coordination:  There is decremation with RAM's, with any form of RAMS, including alternating supination and pronation of the forearm, hand opening and closing, finger taps, heel taps and toe taps on the right.   Gait and Station: The  patient has mild difficulty arising out of a deep-seated chair without the use of the hands.  .  The patient's stride length is normal with no evidence of re-emergent tremor.  He is stooped.  He is just mildly slow.  He is antalgic because of a short leg from previous surgeries.  Labs: Patient had lab work on 06/30/2016.  I reviewed those.  His white blood cells were 5.6, hemoglobin 13.2, hematocrit 39.2 and platelets 175.  Sodium was 142, potassium 4.3, chloride 103, CO2 21, BUN 20, creatinine 1.04 and glucose 100.  AST 18, ALT 14 and alkaline phosphatase 57.  TSH was 2.770.  ASSESSMENT/PLAN:  1.  Tremor  -DaT scan was negative but he still has some rigidity in the RUE.  Hx is more c/w ET.  Pt/wife shows pics of the DaT scan today and it was reviewed with them.  -increase primidone - 100 mg in the AM, 50 mg at night.  Risks, benefits, side effects and alternative therapies were discussed.  The opportunity to ask questions was given and they were answered to the best of my ability.  The patient expressed understanding and willingness to follow the outlined treatment protocols.  -Patient feels that d/c metoprolol actually made tremor better  2.  cervical spinal stenosis with cord myelomalacia  -Patient is scheduled to have surgery with Dr. Venetia Maxon on 01/05/2017.  Talked about the fact that due to myelomalacia, strength may not return in hand but surgery is good idea so that things don't deteriorate.    3.  Much greater than 50% of this visit was spent in counseling and coordinating care.  Total face to face time:  25 min     Cc:  Nicolasa Ducking, MD

## 2016-12-29 ENCOUNTER — Ambulatory Visit (INDEPENDENT_AMBULATORY_CARE_PROVIDER_SITE_OTHER): Payer: Medicare HMO | Admitting: Neurology

## 2016-12-29 ENCOUNTER — Encounter (HOSPITAL_COMMUNITY)
Admission: RE | Admit: 2016-12-29 | Discharge: 2016-12-29 | Disposition: A | Discharge status: Home / Self Care | Payer: Medicare HMO | Source: Ambulatory Visit | Attending: Neurosurgery | Admitting: Neurosurgery

## 2016-12-29 ENCOUNTER — Encounter (HOSPITAL_COMMUNITY): Payer: Self-pay

## 2016-12-29 ENCOUNTER — Encounter: Payer: Self-pay | Admitting: Neurology

## 2016-12-29 VITALS — BP 122/72 | HR 68 | Ht 72.0 in | Wt 249.0 lb

## 2016-12-29 DIAGNOSIS — M4802 Spinal stenosis, cervical region: Secondary | ICD-10-CM | POA: Diagnosis not present

## 2016-12-29 DIAGNOSIS — R251 Tremor, unspecified: Secondary | ICD-10-CM | POA: Diagnosis not present

## 2016-12-29 HISTORY — DX: Age-related osteoporosis without current pathological fracture: M81.0

## 2016-12-29 HISTORY — DX: Atherosclerotic heart disease of native coronary artery without angina pectoris: I25.10

## 2016-12-29 LAB — CBC
HEMATOCRIT: 40.8 % (ref 39.0–52.0)
HEMOGLOBIN: 13.4 g/dL (ref 13.0–17.0)
MCH: 32.1 pg (ref 26.0–34.0)
MCHC: 32.8 g/dL (ref 30.0–36.0)
MCV: 97.6 fL (ref 78.0–100.0)
Platelets: 178 10*3/uL (ref 150–400)
RBC: 4.18 MIL/uL — AB (ref 4.22–5.81)
RDW: 13.4 % (ref 11.5–15.5)
WBC: 6.2 10*3/uL (ref 4.0–10.5)

## 2016-12-29 LAB — BASIC METABOLIC PANEL
ANION GAP: 8 (ref 5–15)
BUN: 22 mg/dL — ABNORMAL HIGH (ref 6–20)
CHLORIDE: 107 mmol/L (ref 101–111)
CO2: 24 mmol/L (ref 22–32)
Calcium: 9.4 mg/dL (ref 8.9–10.3)
Creatinine, Ser: 1.14 mg/dL (ref 0.61–1.24)
GFR calc Af Amer: >60 mL/min (ref >60)
GFR calc non Af Amer: >60 mL/min (ref >60)
GLUCOSE: 94 mg/dL (ref 65–99)
POTASSIUM: 4.4 mmol/L (ref 3.5–5.1)
Sodium: 139 mmol/L (ref 135–145)

## 2016-12-29 LAB — SURGICAL PCR SCREEN
MRSA, PCR: NEGATIVE
Staphylococcus aureus: NEGATIVE

## 2016-12-29 NOTE — Pre-Procedure Instructions (Signed)
Colin Barker  12/29/2016      [id number] - [place], [place] - [address] [place] [id number] Phone: [phone] Fax: [phone]  [facility] [place] [place] [id number] Phone: [phone] Fax: [phone]    Your procedure is scheduled on Tuesday, 01/05/2017.  [facility] Ad[facility] at 10:30 A.M.  Call this number if you have problems the morning of surgery:  [phone]   Remember:  Do not eat food or drink liquids after midnight.  Continue all other medications as directed by your physician except follow these instructions about your medications   Take these medicines the morning of surgery with A SIP OF WATER: Tylenol - if needed Cetirizine (Zyrtec) - if needed Isosorbide mononitrate levothryroxine (synthroid) Omeprazole (Prilosec) Primidone (Mysoline)  7 days prior to surgery STOP taking any Aspirin, Aleve, Naproxen, Ibuprofen, Motrin, Advil, Goody's, BC's, all herbal medications, fish oil, and all vitamins.  Stop your plavix as directed by your physician - 10 days prior to surgery.    Do not wear jewelry  Do not wear lotions, powders, or colognes, or deodorant.  Men may shave their face and neck.    Do not bring valuables to the hospital.  [facility] is not responsible for any belongings or valuables.  Contacts, eyeglasses, dentures or bridgework may not be worn into surgery.  Leave your suitcase in the car.  After surgery it may be brought to your room.  For patients admitted to the hospital, discharge time will be determined by your treatment team.  Patients discharged the day of surgery will not be allowed to drive home.   Name and phone number of your driver:    Special instructions:   - Preparing For Surgery  Before surgery, you can play an important role. Because skin is not sterile, your skin needs to  be as free of germs as possible. You can reduce the number of germs on your skin by washing with CHG (chlorahexidine gluconate) Soap before surgery.  CHG is an antiseptic cleaner which kills germs and bonds with the skin to continue killing germs even after washing.  Please do not use if you have an allergy to CHG or antibacterial soaps. If your skin becomes reddened/irritated stop using the CHG.  Do not shave (including legs and underarms) for at least 48 hours prior to first CHG shower. It is OK to shave your face.  Please follow these instructions carefully.   1. Shower the NIGHT BEFORE SURGERY and the MORNING OF SURGERY with CHG.   2. If you chose to wash your hair, wash your hair first as usual with your normal shampoo.  3. After you shampoo, rinse your hair and body thoroughly to remove the shampoo.  4. Use CHG as you would any other liquid soap. You can apply CHG directly to the skin and wash gently with a scrungie or a clean washcloth.   5. Apply the CHG Soap to your body ONLY FROM THE NECK DOWN.  Do not use on open wounds or open sores. Avoid contact with your eyes, ears, mouth and genitals (private parts). Wash genitals (private parts) with your normal soap.  6. Wash thoroughly, paying special attention to the area where your surgery will be performed.  7. Thoroughly rinse your body with warm water from the neck down.  8. DO NOT  shower/wash with your normal soap after using and rinsing off the CHG Soap.  9. Pat yourself dry with a CLEAN TOWEL.   10. Wear CLEAN PAJAMAS   11. Place CLEAN SHEETS on your bed the night of your first shower and DO NOT SLEEP WITH PETS.    Day of Surgery: shower as stated above Do not apply any deodorants/lotions.  Please wear clean clothes to the hospital/surgery center.      Please read over the following fact sheets that you were given. Pain Booklet, Coughing and Deep Breathing, MRSA Information and Surgical Site Infection  Prevention

## 2016-12-29 NOTE — Patient Instructions (Signed)
Increase primidone - 50 mg - 2 tablets in the AM and 1 tablet at night.  Call me when you need it refilled.

## 2016-12-29 NOTE — Progress Notes (Addendum)
PCP - Dr. Heriberto AntiguaGarlick Cardiologist - Dr. Hyacinth MeekerMiller  Chest x-ray -n/a EKG - 12/10/2016 Stress Test - 2014 ECHO - 2018 Cardiac Cath - 2009  Sleep Study - patient denies    Patient denies shortness of breath, fever, cough and chest pain at PAT appointment   Patient verbalized understanding of instructions that were given to them at the PAT appointment. Patient was also instructed that they will need to review over the PAT instructions again at home before surgery.   Cardiac clearance in care everywhere from Dr. Hyacinth MeekerMiller.  Patient's last dose of Plavix was on 12/25/2016.  Anesthesia review for cardiac history.

## 2016-12-30 NOTE — Progress Notes (Signed)
Anesthesia Chart Review: Patient is a 76 year old male scheduled for C3-4 ACDF on 01/05/17 by Dr. Maeola HarmanJoseph Stern.  History includes never smoker, CAD/ischemic heart disease s/p DIAG PCI '09 (in [place]), HLD, BPH, syncope 03/2015 (provoked syncope after shaking head trying to relieve ear "obstruction"), tremor,  hypothyroidism, osteoporosis, osteoarthritis, tonsillectomy. BMI is consistent with obesity.  - PCP is Dr. Nicolasa DuckingWilliam Garlick.    - Cardiologist is Dr. Christella NoaPaula Miller [facility]; Care Everywhere). Last seen on 12/10/16 by Harvel RicksJulie Lewis, ANP for pre-operative evaluation. Patient given permission to hold Plavix X 10 days prior to surgery and no further testing indicated. - Neurologist is Dr. Lurena Joinerebecca Tat. Seen on 12/29/16 for evaluation of tremor.  Meds include aspirin 81 mg (to hold 7 days prior to surgery), Zyrtec, Plavix (last dose 12/25/16), Imdur, levothyroxine, Prilosec, primidone, Zocor.   BP (!) 118/57   Pulse 62   Temp 36.5 C   Resp 20   Ht 6' (1.829 m)   Wt 247 lb 12.8 oz (112.4 kg)   SpO2 100%   BMI 33.61 kg/m   EKG 12/10/16 [facility]): NSR, baseline artifact.   Echo 06/08/16 [facility]; Care Everywhere): Result Narrative:  Technically difficult study due to chest wall/lung interference  Normal left ventricular systolic function, ejection fraction > 55%  Left ventricular hypertrophy - mild  Diastolic dysfunction - grade II (elevated filling pressures)  Degenerative mitral valve disease  Mitral regurgitation - mild  Dilated left atrium - mild  Aortic sclerosis  Aortic regurgitation - mild  Normal right ventricular systolic function  Tricuspid regurgitation - mild  PET Myocardial Perfusion 10/14/114 [facility]; Care Everywhere): Impressions: - Normal myocardial perfusion study. - No evidence for significant ischemia or scar is noted. - Global systolic function is normal.The ejection fraction calculated at  56% at stress and 52% at rest.There is normal wall  thickening.Right  ventricular chamber size is borderline dilated. - Compared to the previous study of Regadenosine Myocardial Perfusion  Study (non-attenuation correction study) on 11/30/11, the inferior wall  defect has resolved which suggests previous attenuation artifact.Wall  motion appears unchanged. - Coronary calcification are noted in LM, LAD and RCA.  MRI C-spine 09/20/16: IMPRESSION: 1. At C3-4 there is a broad-based disc osteophyte complex with a central disc protrusion deforming the ventral cervical spinal cord. Moderate spinal stenosis. Mild bilateral facet arthropathy. No significant foraminal stenosis. Tiny focal area of T2 hyperintensity in the cervical spinal cord of the level of C3-4 likely reflecting mild myelomalacia secondary to disc disease. 2. Cervical spine spondylosis as described above (see Result Review tab).  Preoperative labs noted.   If no acute changes then I anticipate that he can proceed as planned.  Velna Ochsllison Heyward Douthit, PA-C [facility]/Anesthesiology Phone [phone] 12/30/2016 2:47 PM

## 2017-01-05 ENCOUNTER — Ambulatory Visit (HOSPITAL_COMMUNITY)
Admission: RE | Admit: 2017-01-05 | Discharge: 2017-01-06 | Disposition: A | Discharge status: Home / Self Care | Payer: Medicare HMO | Source: Ambulatory Visit | Attending: Neurosurgery | Admitting: Neurosurgery

## 2017-01-05 ENCOUNTER — Ambulatory Visit (HOSPITAL_COMMUNITY): Payer: Medicare HMO | Admitting: Anesthesiology

## 2017-01-05 ENCOUNTER — Encounter (HOSPITAL_COMMUNITY): Payer: Self-pay | Admitting: Urology

## 2017-01-05 ENCOUNTER — Ambulatory Visit (HOSPITAL_COMMUNITY): Payer: Medicare HMO | Admitting: Vascular Surgery

## 2017-01-05 ENCOUNTER — Encounter (HOSPITAL_COMMUNITY)
Admission: RE | Disposition: A | Discharge status: Home / Self Care | Payer: Self-pay | Source: Ambulatory Visit | Attending: Neurosurgery

## 2017-01-05 ENCOUNTER — Ambulatory Visit (HOSPITAL_COMMUNITY): Payer: Medicare HMO

## 2017-01-05 DIAGNOSIS — E785 Hyperlipidemia, unspecified: Secondary | ICD-10-CM | POA: Insufficient documentation

## 2017-01-05 DIAGNOSIS — I10 Essential (primary) hypertension: Secondary | ICD-10-CM | POA: Diagnosis not present

## 2017-01-05 DIAGNOSIS — G992 Myelopathy in diseases classified elsewhere: Secondary | ICD-10-CM | POA: Diagnosis present

## 2017-01-05 DIAGNOSIS — M5001 Cervical disc disorder with myelopathy,  high cervical region: Secondary | ICD-10-CM | POA: Insufficient documentation

## 2017-01-05 DIAGNOSIS — I259 Chronic ischemic heart disease, unspecified: Secondary | ICD-10-CM | POA: Insufficient documentation

## 2017-01-05 DIAGNOSIS — Z955 Presence of coronary angioplasty implant and graft: Secondary | ICD-10-CM | POA: Diagnosis not present

## 2017-01-05 DIAGNOSIS — E538 Deficiency of other specified B group vitamins: Secondary | ICD-10-CM | POA: Diagnosis not present

## 2017-01-05 DIAGNOSIS — M1990 Unspecified osteoarthritis, unspecified site: Secondary | ICD-10-CM | POA: Diagnosis not present

## 2017-01-05 DIAGNOSIS — I2510 Atherosclerotic heart disease of native coronary artery without angina pectoris: Secondary | ICD-10-CM | POA: Insufficient documentation

## 2017-01-05 DIAGNOSIS — E559 Vitamin D deficiency, unspecified: Secondary | ICD-10-CM | POA: Diagnosis not present

## 2017-01-05 DIAGNOSIS — M4802 Spinal stenosis, cervical region: Secondary | ICD-10-CM | POA: Diagnosis present

## 2017-01-05 DIAGNOSIS — Z9889 Other specified postprocedural states: Secondary | ICD-10-CM | POA: Insufficient documentation

## 2017-01-05 DIAGNOSIS — G250 Essential tremor: Secondary | ICD-10-CM | POA: Diagnosis not present

## 2017-01-05 DIAGNOSIS — Z888 Allergy status to other drugs, medicaments and biological substances status: Secondary | ICD-10-CM | POA: Insufficient documentation

## 2017-01-05 DIAGNOSIS — E039 Hypothyroidism, unspecified: Secondary | ICD-10-CM | POA: Insufficient documentation

## 2017-01-05 HISTORY — PX: ANTERIOR CERVICAL DECOMP/DISCECTOMY FUSION: SHX1161

## 2017-01-05 LAB — TYPE AND SCREEN
ABO/RH(D): O NEG
ANTIBODY SCREEN: POSITIVE

## 2017-01-05 SURGERY — ANTERIOR CERVICAL DECOMPRESSION/DISCECTOMY FUSION 1 LEVEL
Anesthesia: General

## 2017-01-05 MED ORDER — ALUM & MAG HYDROXIDE-SIMETH 200-200-20 MG/5ML PO SUSP: 30.0000 mL | Freq: Four times a day (QID) | ORAL | Status: DC | PRN

## 2017-01-05 MED ORDER — HEMOSTATIC AGENTS (NO CHARGE) OPTIME
TOPICAL | Status: DC | PRN
  Administered 2017-01-05: 1 via TOPICAL

## 2017-01-05 MED ORDER — SODIUM CHLORIDE 0.9% FLUSH: 3.0000 mL | INTRAVENOUS | Status: DC | PRN

## 2017-01-05 MED ORDER — ACETAMINOPHEN 500 MG PO TABS: 1000.0000 mg | ORAL_TABLET | Freq: Four times a day (QID) | ORAL | Status: DC | PRN

## 2017-01-05 MED ORDER — 0.9 % SODIUM CHLORIDE (POUR BTL) OPTIME
TOPICAL | Status: DC | PRN
Start: 2017-01-05 — End: 2017-01-05
  Administered 2017-01-05: 1000 mL

## 2017-01-05 MED ORDER — CHLORHEXIDINE GLUCONATE CLOTH 2 % EX PADS: 6.0000 | MEDICATED_PAD | Freq: Once | CUTANEOUS | Status: DC

## 2017-01-05 MED ORDER — THROMBIN 5000 UNITS EX SOLR
OROMUCOSAL | Status: DC | PRN
  Administered 2017-01-05: 14:00:00 via TOPICAL

## 2017-01-05 MED ORDER — DEXAMETHASONE SODIUM PHOSPHATE 4 MG/ML IJ SOLN
INTRAMUSCULAR | Status: DC | PRN
  Administered 2017-01-05: 10 mg via INTRAVENOUS

## 2017-01-05 MED ORDER — FENTANYL CITRATE (PF) 100 MCG/2ML IJ SOLN
INTRAMUSCULAR | Status: DC | PRN
  Administered 2017-01-05 (×5): 50 ug via INTRAVENOUS

## 2017-01-05 MED ORDER — LORATADINE 10 MG PO TABS: 10.0000 mg | ORAL_TABLET | Freq: Every day | ORAL | Status: DC

## 2017-01-05 MED ORDER — LEVOTHYROXINE SODIUM 100 MCG PO TABS: 50.0000 ug | ORAL_TABLET | Freq: Every day | ORAL | Status: DC

## 2017-01-05 MED ORDER — MORPHINE SULFATE (PF) 4 MG/ML IV SOLN: 2.0000 mg | INTRAVENOUS | Status: DC | PRN

## 2017-01-05 MED ORDER — MIDAZOLAM HCL 2 MG/2ML IJ SOLN
INTRAMUSCULAR | Status: AC
  Filled 2017-01-05: qty 2

## 2017-01-05 MED ORDER — SODIUM CHLORIDE 0.9% FLUSH: 3.0000 mL | Freq: Two times a day (BID) | INTRAVENOUS | Status: DC

## 2017-01-05 MED ORDER — BISACODYL 10 MG RE SUPP
10.0000 mg | Freq: Every day | RECTAL | Status: DC | PRN
Start: 2017-01-05 — End: 2017-01-06

## 2017-01-05 MED ORDER — LIDOCAINE 2% (20 MG/ML) 5 ML SYRINGE
INTRAMUSCULAR | Status: AC
  Filled 2017-01-05: qty 5

## 2017-01-05 MED ORDER — SENNOSIDES-DOCUSATE SODIUM 8.6-50 MG PO TABS: 1.0000 | ORAL_TABLET | Freq: Every evening | ORAL | Status: DC | PRN

## 2017-01-05 MED ORDER — ROCURONIUM BROMIDE 100 MG/10ML IV SOLN
INTRAVENOUS | Status: DC | PRN
  Administered 2017-01-05: 50 mg via INTRAVENOUS

## 2017-01-05 MED ORDER — LIDOCAINE-EPINEPHRINE 1 %-1:100000 IJ SOLN
INTRAMUSCULAR | Status: AC
  Filled 2017-01-05: qty 1

## 2017-01-05 MED ORDER — FENTANYL CITRATE (PF) 100 MCG/2ML IJ SOLN
25.0000 ug | INTRAMUSCULAR | Status: DC | PRN
  Administered 2017-01-05 (×2): 50 ug via INTRAVENOUS

## 2017-01-05 MED ORDER — LIDOCAINE HCL (CARDIAC) 20 MG/ML IV SOLN
INTRAVENOUS | Status: DC | PRN
  Administered 2017-01-05: 50 mg via INTRAVENOUS

## 2017-01-05 MED ORDER — THROMBIN 5000 UNITS EX SOLR
CUTANEOUS | Status: AC
  Filled 2017-01-05: qty 5000

## 2017-01-05 MED ORDER — VITAMIN D 1000 UNITS PO TABS
2000.0000 [IU] | ORAL_TABLET | Freq: Every day | ORAL | Status: DC
  Administered 2017-01-05: 2000 [IU] via ORAL
  Filled 2017-01-05: qty 2

## 2017-01-05 MED ORDER — SUGAMMADEX SODIUM 500 MG/5ML IV SOLN
INTRAVENOUS | Status: AC
  Filled 2017-01-05: qty 5

## 2017-01-05 MED ORDER — ONDANSETRON HCL 4 MG/2ML IJ SOLN: 4.0000 mg | Freq: Four times a day (QID) | INTRAMUSCULAR | Status: DC | PRN

## 2017-01-05 MED ORDER — ASPIRIN EC 81 MG PO TBEC
81.0000 mg | DELAYED_RELEASE_TABLET | Freq: Every day | ORAL | Status: DC
  Administered 2017-01-05: 81 mg via ORAL
  Filled 2017-01-05: qty 1

## 2017-01-05 MED ORDER — CEFAZOLIN SODIUM-DEXTROSE 2-4 GM/100ML-% IV SOLN
INTRAVENOUS | Status: AC
  Filled 2017-01-05: qty 100

## 2017-01-05 MED ORDER — SIMVASTATIN 20 MG PO TABS
40.0000 mg | ORAL_TABLET | Freq: Every day | ORAL | Status: DC
  Administered 2017-01-05: 40 mg via ORAL
  Filled 2017-01-05: qty 2

## 2017-01-05 MED ORDER — SODIUM CHLORIDE 0.9 % IV SOLN: 250.0000 mL | INTRAVENOUS | Status: DC

## 2017-01-05 MED ORDER — EPHEDRINE 5 MG/ML INJ
INTRAVENOUS | Status: AC
  Filled 2017-01-05: qty 10

## 2017-01-05 MED ORDER — BUPIVACAINE HCL (PF) 0.5 % IJ SOLN
INTRAMUSCULAR | Status: DC | PRN
  Administered 2017-01-05: 2.5 mL

## 2017-01-05 MED ORDER — MENTHOL 3 MG MT LOZG: 1.0000 | LOZENGE | OROMUCOSAL | Status: DC | PRN

## 2017-01-05 MED ORDER — LEVOTHYROXINE SODIUM 100 MCG PO TABS: 100.0000 ug | ORAL_TABLET | ORAL | Status: DC

## 2017-01-05 MED ORDER — HYDROCODONE-ACETAMINOPHEN 5-325 MG PO TABS
1.0000 | ORAL_TABLET | ORAL | Status: DC | PRN
  Administered 2017-01-05 – 2017-01-06 (×4): 2 via ORAL
  Filled 2017-01-05 (×4): qty 2

## 2017-01-05 MED ORDER — ACETAMINOPHEN 650 MG RE SUPP: 650.0000 mg | RECTAL | Status: DC | PRN

## 2017-01-05 MED ORDER — METHOCARBAMOL 1000 MG/10ML IJ SOLN
500.0000 mg | Freq: Four times a day (QID) | INTRAVENOUS | Status: DC | PRN
  Filled 2017-01-05: qty 5

## 2017-01-05 MED ORDER — ACETAMINOPHEN 325 MG PO TABS
650.0000 mg | ORAL_TABLET | ORAL | Status: DC | PRN
Start: 2017-01-05 — End: 2017-01-05

## 2017-01-05 MED ORDER — PROMETHAZINE HCL 25 MG/ML IJ SOLN: 6.2500 mg | INTRAMUSCULAR | Status: DC | PRN

## 2017-01-05 MED ORDER — LIDOCAINE-EPINEPHRINE 1 %-1:100000 IJ SOLN
INTRAMUSCULAR | Status: DC | PRN
Start: 2017-01-05 — End: 2017-01-05
  Administered 2017-01-05: 2.5 mL

## 2017-01-05 MED ORDER — CEFAZOLIN SODIUM-DEXTROSE 2-4 GM/100ML-% IV SOLN
2.0000 g | INTRAVENOUS | Status: AC
  Administered 2017-01-05: 2 g via INTRAVENOUS

## 2017-01-05 MED ORDER — EPHEDRINE SULFATE 50 MG/ML IJ SOLN
INTRAMUSCULAR | Status: DC | PRN
  Administered 2017-01-05 (×2): 10 mg via INTRAVENOUS

## 2017-01-05 MED ORDER — CEFAZOLIN SODIUM 1 G IJ SOLR
INTRAMUSCULAR | Status: AC
  Filled 2017-01-05: qty 30

## 2017-01-05 MED ORDER — PROPOFOL 10 MG/ML IV BOLUS
INTRAVENOUS | Status: AC
  Filled 2017-01-05: qty 20

## 2017-01-05 MED ORDER — ZOLPIDEM TARTRATE 5 MG PO TABS: 5.0000 mg | ORAL_TABLET | Freq: Every evening | ORAL | Status: DC | PRN

## 2017-01-05 MED ORDER — LACTATED RINGERS IV SOLN
INTRAVENOUS | Status: DC | PRN
  Administered 2017-01-05: 11:00:00 via INTRAVENOUS

## 2017-01-05 MED ORDER — DEXAMETHASONE SODIUM PHOSPHATE 10 MG/ML IJ SOLN
INTRAMUSCULAR | Status: AC
  Filled 2017-01-05: qty 1

## 2017-01-05 MED ORDER — FENTANYL CITRATE (PF) 250 MCG/5ML IJ SOLN
INTRAMUSCULAR | Status: AC
  Filled 2017-01-05: qty 5

## 2017-01-05 MED ORDER — THROMBIN 5000 UNITS EX SOLR
CUTANEOUS | Status: DC | PRN
  Administered 2017-01-05 (×2): 5000 [IU] via TOPICAL

## 2017-01-05 MED ORDER — PANTOPRAZOLE SODIUM 40 MG PO TBEC: 80.0000 mg | DELAYED_RELEASE_TABLET | Freq: Every day | ORAL | Status: DC

## 2017-01-05 MED ORDER — LEVOTHYROXINE SODIUM 100 MCG PO TABS
50.0000 ug | ORAL_TABLET | ORAL | Status: DC
Start: 2017-01-06 — End: 2017-01-06
  Administered 2017-01-06: 50 ug via ORAL
  Filled 2017-01-05: qty 1

## 2017-01-05 MED ORDER — CEFAZOLIN SODIUM-DEXTROSE 2-4 GM/100ML-% IV SOLN
2.0000 g | Freq: Three times a day (TID) | INTRAVENOUS | Status: AC
  Administered 2017-01-05 – 2017-01-06 (×2): 2 g via INTRAVENOUS
  Filled 2017-01-05 (×2): qty 100

## 2017-01-05 MED ORDER — FLEET ENEMA 7-19 GM/118ML RE ENEM: 1.0000 | ENEMA | Freq: Once | RECTAL | Status: DC | PRN

## 2017-01-05 MED ORDER — ONDANSETRON HCL 4 MG PO TABS: 4.0000 mg | ORAL_TABLET | Freq: Four times a day (QID) | ORAL | Status: DC | PRN

## 2017-01-05 MED ORDER — SUGAMMADEX SODIUM 200 MG/2ML IV SOLN
INTRAVENOUS | Status: DC | PRN
  Administered 2017-01-05: 300 mg via INTRAVENOUS

## 2017-01-05 MED ORDER — ROCURONIUM BROMIDE 10 MG/ML (PF) SYRINGE
PREFILLED_SYRINGE | INTRAVENOUS | Status: AC
  Filled 2017-01-05: qty 5

## 2017-01-05 MED ORDER — ISOSORBIDE MONONITRATE ER 30 MG PO TB24
30.0000 mg | ORAL_TABLET | Freq: Every day | ORAL | Status: DC
  Filled 2017-01-05: qty 1

## 2017-01-05 MED ORDER — FENTANYL CITRATE (PF) 100 MCG/2ML IJ SOLN
INTRAMUSCULAR | Status: AC
  Administered 2017-01-05: 50 ug via INTRAVENOUS
  Filled 2017-01-05: qty 2

## 2017-01-05 MED ORDER — PROPOFOL 10 MG/ML IV BOLUS
INTRAVENOUS | Status: DC | PRN
  Administered 2017-01-05: 130 mg via INTRAVENOUS

## 2017-01-05 MED ORDER — KCL IN DEXTROSE-NACL 20-5-0.45 MEQ/L-%-% IV SOLN: INTRAVENOUS | Status: DC

## 2017-01-05 MED ORDER — PHENOL 1.4 % MT LIQD: 1.0000 | OROMUCOSAL | Status: DC | PRN

## 2017-01-05 MED ORDER — ONDANSETRON HCL 4 MG/2ML IJ SOLN
INTRAMUSCULAR | Status: AC
  Filled 2017-01-05: qty 2

## 2017-01-05 MED ORDER — BUPIVACAINE HCL (PF) 0.5 % IJ SOLN
INTRAMUSCULAR | Status: AC
  Filled 2017-01-05: qty 30

## 2017-01-05 MED ORDER — PRIMIDONE 50 MG PO TABS
50.0000 mg | ORAL_TABLET | Freq: Two times a day (BID) | ORAL | Status: DC
  Administered 2017-01-05: 50 mg via ORAL
  Filled 2017-01-05 (×2): qty 1

## 2017-01-05 MED ORDER — THROMBIN 5000 UNITS EX SOLR
CUTANEOUS | Status: AC
  Filled 2017-01-05: qty 10000

## 2017-01-05 MED ORDER — CYANOCOBALAMIN 1000 MCG/ML IJ SOLN: 1000.0000 ug | INTRAMUSCULAR | Status: DC

## 2017-01-05 MED ORDER — METHOCARBAMOL 500 MG PO TABS
500.0000 mg | ORAL_TABLET | Freq: Four times a day (QID) | ORAL | Status: DC | PRN
  Administered 2017-01-05 – 2017-01-06 (×2): 500 mg via ORAL
  Filled 2017-01-05 (×2): qty 1

## 2017-01-05 SURGICAL SUPPLY — 67 items
BASKET BONE COLLECTION (BASKET) ×3 IMPLANT
BIT DRILL 14X2.5XNS TI ANT (BIT) ×1 IMPLANT
BIT DRILL AVIATOR 14 (BIT) ×1
BIT DRILL AVIATOR 14MM (BIT) ×1
BIT DRILL NEURO 2X3.1 SFT TUCH (MISCELLANEOUS) ×1 IMPLANT
BIT DRL 14X2.5XNS TI ANT (BIT) ×1
BLADE SURG 10 STRL SS (BLADE) ×3 IMPLANT
BLADE ULTRA TIP 2M (BLADE) IMPLANT
BNDG GAUZE ELAST 4 BULKY (GAUZE/BANDAGES/DRESSINGS) IMPLANT
BUR BARREL STRAIGHT FLUTE 4.0 (BURR) ×3 IMPLANT
CANISTER SUCT 3000ML PPV (MISCELLANEOUS) ×3 IMPLANT
CARTRIDGE OIL MAESTRO DRILL (MISCELLANEOUS) ×1 IMPLANT
COVER MAYO STAND STRL (DRAPES) ×3 IMPLANT
DECANTER SPIKE VIAL GLASS SM (MISCELLANEOUS) ×3 IMPLANT
DERMABOND ADVANCED (GAUZE/BANDAGES/DRESSINGS) ×2
DERMABOND ADVANCED .7 DNX12 (GAUZE/BANDAGES/DRESSINGS) ×1 IMPLANT
DIFFUSER DRILL AIR PNEUMATIC (MISCELLANEOUS) ×3 IMPLANT
DRAPE HALF SHEET 40X57 (DRAPES) IMPLANT
DRAPE LAPAROTOMY 100X72 PEDS (DRAPES) ×3 IMPLANT
DRAPE MICROSCOPE LEICA (MISCELLANEOUS) ×3 IMPLANT
DRAPE POUCH INSTRU U-SHP 10X18 (DRAPES) ×3 IMPLANT
DRILL NEURO 2X3.1 SOFT TOUCH (MISCELLANEOUS) ×3
DRSG OPSITE POSTOP 3X4 (GAUZE/BANDAGES/DRESSINGS) ×3 IMPLANT
DURAPREP 6ML APPLICATOR 50/CS (WOUND CARE) ×3 IMPLANT
ELECT COATED BLADE 2.86 ST (ELECTRODE) ×3 IMPLANT
ELECT REM PT RETURN 9FT ADLT (ELECTROSURGICAL) ×3
ELECTRODE REM PT RTRN 9FT ADLT (ELECTROSURGICAL) ×1 IMPLANT
GAUZE SPONGE 4X4 12PLY STRL (GAUZE/BANDAGES/DRESSINGS) IMPLANT
GAUZE SPONGE 4X4 16PLY XRAY LF (GAUZE/BANDAGES/DRESSINGS) IMPLANT
GLOVE BIO SURGEON STRL SZ8 (GLOVE) ×3 IMPLANT
GLOVE BIOGEL PI IND STRL 8 (GLOVE) ×1 IMPLANT
GLOVE BIOGEL PI IND STRL 8.5 (GLOVE) ×1 IMPLANT
GLOVE BIOGEL PI INDICATOR 8 (GLOVE) ×2
GLOVE BIOGEL PI INDICATOR 8.5 (GLOVE) ×2
GLOVE ECLIPSE 8.0 STRL XLNG CF (GLOVE) ×3 IMPLANT
GLOVE EXAM NITRILE LRG STRL (GLOVE) IMPLANT
GLOVE EXAM NITRILE XL STR (GLOVE) IMPLANT
GLOVE EXAM NITRILE XS STR PU (GLOVE) IMPLANT
GOWN STRL REUS W/ TWL LRG LVL3 (GOWN DISPOSABLE) IMPLANT
GOWN STRL REUS W/ TWL XL LVL3 (GOWN DISPOSABLE) IMPLANT
GOWN STRL REUS W/TWL 2XL LVL3 (GOWN DISPOSABLE) IMPLANT
GOWN STRL REUS W/TWL LRG LVL3 (GOWN DISPOSABLE)
GOWN STRL REUS W/TWL XL LVL3 (GOWN DISPOSABLE)
HALTER HD/CHIN CERV TRACTION D (MISCELLANEOUS) ×3 IMPLANT
HEMOSTAT POWDER KIT SURGIFOAM (HEMOSTASIS) ×3 IMPLANT
KIT BASIN OR (CUSTOM PROCEDURE TRAY) ×3 IMPLANT
KIT ROOM TURNOVER OR (KITS) ×3 IMPLANT
NEEDLE HYPO 18GX1.5 BLUNT FILL (NEEDLE) IMPLANT
NEEDLE HYPO 25X1 1.5 SAFETY (NEEDLE) ×3 IMPLANT
NEEDLE SPNL 22GX3.5 QUINCKE BK (NEEDLE) ×3 IMPLANT
NS IRRIG 1000ML POUR BTL (IV SOLUTION) ×3 IMPLANT
OIL CARTRIDGE MAESTRO DRILL (MISCELLANEOUS) ×3
PACK LAMINECTOMY NEURO (CUSTOM PROCEDURE TRAY) ×3 IMPLANT
PAD ARMBOARD 7.5X6 YLW CONV (MISCELLANEOUS) ×9 IMPLANT
PEEK AVS AS 6X12X4 (Peek) ×3 IMPLANT
PIN DISTRACTION 14MM (PIN) ×6 IMPLANT
PLATE AVIATOR ASSY 1LVL SZ 12 (Plate) ×3 IMPLANT
RUBBERBAND STERILE (MISCELLANEOUS) ×6 IMPLANT
SCREW AVIATOR VAR SELFTAP 4X14 (Screw) ×12 IMPLANT
SPONGE INTESTINAL PEANUT (DISPOSABLE) ×3 IMPLANT
SPONGE SURGIFOAM ABS GEL SZ50 (HEMOSTASIS) ×3 IMPLANT
STAPLER SKIN PROX WIDE 3.9 (STAPLE) IMPLANT
SUT VIC AB 3-0 SH 8-18 (SUTURE) ×6 IMPLANT
SYR 3ML LL SCALE MARK (SYRINGE) IMPLANT
TOWEL GREEN STERILE (TOWEL DISPOSABLE) ×3 IMPLANT
TOWEL GREEN STERILE FF (TOWEL DISPOSABLE) ×3 IMPLANT
WATER STERILE IRR 1000ML POUR (IV SOLUTION) ×3 IMPLANT

## 2017-01-05 NOTE — Brief Op Note (Signed)
01/05/2017  3:11 PM  PATIENT:  Colin LandauBobby J Leffler  76 y.o. 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  PROCEDURE:  Procedure(s) with comments: C3-4 Anterior cervical decompression/discectomy/fusion (N/A) - C3-4 Anterior cervical decompression/discectomy/fusion with PEEK cage, autograft, plate  SURGEON:  Surgeon(s) and Role:    Maeola Harman* Keath Matera, MD - Primary    * Lisbeth RenshawNundkumar, Neelesh, MD - Assisting  PHYSICIAN ASSISTANT:   ASSISTANTS: Nundkumar   ANESTHESIA:   general  EBL:  Total I/O In: 800 [I.V.:800] Out: 100 [Blood:100]  BLOOD ADMINISTERED:none  DRAINS: none   LOCAL MEDICATIONS USED:  MARCAINE    and LIDOCAINE   SPECIMEN:  No Specimen  DISPOSITION OF SPECIMEN:  N/A  COUNTS:  YES  TOURNIQUET:  * No tourniquets in log *  DICTATION: DICTATION: Patient is 76 year old male with stenosis of C3 on 4 with myelopathy, cord compression, HNP.  It was elected to take him to surgery for anterior cervical decompression and fusion C 34 level.  PROCEDURE: Patient was brought to operating room and following the smooth and uncomplicated induction of general endotracheal anesthesia his head was placed on a horseshoe head holder he was placed in 5 pounds of Holter traction and his anterior neck was prepped and draped in usual sterile fashion. An incision was made on the left side of midline after infiltrating the skin and subcutaneous tissues with local lidocaine. The platysmal layer was incised and subplatysmal dissection was performed exposing the anterior border sternocleidomastoid muscle. Using blunt dissection the carotid sheath was kept lateral and trachea and esophagus kept medial exposing the anterior cervical spine. A bent spinal needle was placed it was felt to be the C 34 level and this was  confirmed on intraoperative x-ray. Longus coli muscles were taken down from the anterior cervical spine using electrocautery and key elevator and self-retaining retractor was placed exposing the C 34 level. The interspace was incised and a thorough discectomy was performed. Distraction pins were placed. Uncinate spurs and central spondylitic ridges were drilled down with a high-speed drill. The spinal cord dura and both C4 nerve roots were widely decompressed. Hemostasis was assured. After trial sizing a 6 mm lordotic lordotic PEEK cage was selected and packed with local autograft. This was tamped into position and countersunk appropriately. Distraction weight was removed. A 12 mm Aviator anterior cervical plate was affixed to the cervical spine with 14 mm variable-angle screws 2 at C3, 2 at C4. All screws were well-positioned and locking mechanisms were engaged. A final X ray was obtained which showed well positioned graft and anterior plate without complicating features. Soft tissues were inspected and found to be in good repair. The wound was irrigated. The platysma layer was closed with 3-0 Vicryl stitches and the skin was reapproximated with 3-0 Vicryl subcuticular stitches. The wound was dressed with Dermabond. Counts were correct at the end of the case. Patient was extubated and taken to recovery in stable and satisfactory condition.   PLAN OF CARE: Admit to inpatient   PATIENT DISPOSITION:  PACU - hemodynamically stable.   Delay start of Pharmacological VTE agent (>24hrs) due to surgical blood loss or risk of bleeding: yes

## 2017-01-05 NOTE — H&P (Signed)
Patient ID:   [id number] Patient: Colin Barker  Date of Birth: [birth year] Visit Type: Office Visit   Date: 11/30/2016 09:15 AM Provider: Danae Orleans. Venetia Maxon MD   This 76 year old male presents for neck pain.   History of Present Illness: 1.  neck pain  Jatavis Malek, husband of patient [name], is referred for evaluation by Dr. Lurena Joiner Tat for cervical stenosis and essential tremor.  Patient reports improving bilateral hand tremor with increased dose of primidone. Per Dr. Don Perking reports, reflexes are jumpy. Patient reports neck stiffness.   History:  CAD [90 percent blockage found on routine physical, failed stress test, no issues since stent 2007], essential tremor Surgical history:  Right leg x3 2004, heart stent 2007  MRI on [facility]  Physical: Significant left arm tremor. 4-/5 left hand intrinsics, 4/5 right hand intrinsics, negative Hoffman's bilaterally. Full strength in lower extremities. Positive suprapatellar and cross adductors. Cervical pin sensory level. Fundoscopic exam is normal. Cranial nerve exam is normal.           PAST MEDICAL/SURGICAL HISTORY   (Detailed)  Disease/disorder Onset Date Management Date Comments    R leg      leg surgery    Benign prostatic hyperplasia      Chronic ischemic heart disease      Vitamin B12 deficiency      Vitamin D deficiency      Elevated lipids      Thyroid disease        Cataract extraction    Arthritis         Family History  (Detailed) Patient reports there is no relevant family history.     Social History:  (Detailed) Tobacco use reviewed. Preferred language is Unknown.   Tobacco use status: Current non-smoker. Smoking status: Never smoker.  SMOKING STATUS Type Smoking Status Usage Per Day Years Used Total Pack Years   Never smoker          MEDICATIONS(added, continued or stopped this visit): Started Medication Directions Instruction Stopped   aspirin 81 mg tablet,delayed release take 1 tablet by  oral route  every day     isosorbide dinitrate 30 mg tablet take 1 tablet by oral route  every day     pantoprazole 40 mg tablet,delayed release take 1 tablet by oral route  every day     Plavix 75 mg tablet take 1 tablet by oral route  every day     primidone 50 mg tablet take 1 tablet by oral route 2 times every day     Tirosint 100 mcg capsule take 1 capsule by oral route  every day     Tylenol Extra Strength 500 mg tablet take 2 tablet by oral route  every 6 hours as needed     vitamin b12      Vitamin D3 2,000 unit capsule      Zocor 40 mg tablet take 1 tablet by oral route  every day in the evening       ALLERGIES: Ingredient Reaction Medication Name Comment  ROSUVASTATIN CALCIUM  Crestor    Reviewed, updated.   Review of Systems System Neg/Pos Details  Constitutional Negative Chills, Fatigue, Fever, Malaise, Night sweats, Weight gain and Weight loss.  ENMT Negative Ear drainage, Hearing loss, Nasal drainage, Otalgia, Sinus pressure and Sore throat.  Eyes Negative Eye discharge, Eye pain and Vision changes.  Respiratory Negative Chronic cough, Cough, Dyspnea, Known TB exposure and Wheezing.  Cardio Negative Chest pain, Claudication, Edema and  Irregular heartbeat/palpitations.  GI Negative Abdominal pain, Blood in stool, Change in stool pattern, Constipation, Decreased appetite, Diarrhea, Heartburn, Nausea and Vomiting.  GU Negative Dribbling, Dysuria, Erectile dysfunction, Hematuria, Polyuria (Genitourinary), Slow stream, Urinary frequency, Urinary incontinence and Urinary retention.  Endocrine Negative Cold intolerance, Heat intolerance, Polydipsia and Polyphagia.  Neuro Positive Tremors.  Psych Negative Anxiety, Depression and Insomnia.  Integumentary Negative Brittle hair, Brittle nails, Change in shape/size of mole(s), Hair loss, Hirsutism, Hives, Pruritus, Rash and Skin lesion.  MS Negative Back pain, Joint pain, Joint swelling, Muscle weakness and Neck pain.  Hema/Lymph  Negative Easy bleeding, Easy bruising and Lymphadenopathy.  Allergic/Immuno Negative Contact allergy, Environmental allergies, Food allergies and Seasonal allergies.  Reproductive Negative Penile discharge and Sexual dysfunction.   Vitals Date Temp F BP Pulse Ht In Wt Lb BMI BSA Pain Score  11/30/2016  146/85 67 72 254.2 34.48  0/10     PHYSICAL EXAM General Level of Distress: no acute distress Overall Appearance: normal  Head and Face  Right Left  Fundoscopic Exam:  normal normal    Cardiovascular Cardiac: regular rate and rhythm without murmur  Right Left  Carotid Pulses: normal normal  Respiratory Lungs: clear to auscultation  Neurological Orientation: normal Recent and Remote Memory: normal Attention Span and Concentration:   normal Language: normal Fund of Knowledge: normal  Right Left Sensation: normal normal Upper Extremity Coordination: normal normal  Lower Extremity Coordination: normal normal  Musculoskeletal Gait and Station: normal  Right Left Upper Extremity Muscle Strength: normal normal Lower Extremity Muscle Strength: normal normal Upper Extremity Muscle Tone:  normal normal Lower Extremity Muscle Tone: normal normal   Motor Strength Upper and lower extremity motor strength was tested in the clinically pertinent muscles.     Deep Tendon Reflexes  Right Left Biceps: normal normal Triceps: normal normal Brachioradialis: normal normal Patellar: normal normal Achilles: normal normal  Cranial Nerves II. Optic Nerve/Visual Fields: normal III. Oculomotor: normal IV. Trochlear: normal V. Trigeminal: normal VI. Abducens: normal VII. Facial: normal VIII. Acoustic/Vestibular: normal IX. Glossopharyngeal: normal X. Vagus: normal XI. Spinal Accessory: normal XII. Hypoglossal: normal  Motor and other Tests Lhermittes: negative Rhomberg: negative Pronator  drift: absent     Right Left Hoffman's: normal normal Clonus: normal normal Babinski: normal normal   Additional Findings:  Significant left arm tremor. 4-/5 left hand intrinsics, 4/5 right hand intrinsics, negative Hoffman's bilaterally. Full strength in lower extremities. Positive suprapatellar and cross adductors. Cervical pin sensory level. Fundoscopic exam is normal. Cranial nerve exam is normal.   DIAGNOSTIC RESULTS MRI 09/10/16: At C3-4 there is broad-based disc osteophyte complex with a central disc protrusion deforming the ventral cervical spinal cord. Moderate spinal stenosis. Mild bilateral facet arthropathy. No significant foraminal stenosis. Tiny focal area of T2 hyperintensity in the cervical spinal cord of the level C3-4 likely reflecting mild myelomalacia secondary to disc disease. Cervical spine spondylosis.    IMPRESSION Patient has been referred for cervical stenosis. MRI shows broad-based disc osteophyte complex with a central disc protrusion deforming the ventral cervical spinal cord at C3-4. We discussed the effects of spinal cord compression in the neck. On confrontational testing, hand weakness, worse on the left, as well as positive suprapatellar and cross adductors. Advised patient to refer to his cardiologist for permission to come off of Plavix for surgery. Schedule C3-4 ACDF.  Completed Orders (this encounter) Order Details Reason Side Interpretation Result Initial Treatment Date Region  Dietary management education, guidance, and counseling Encouraged to eat well balanced diet and  follow up with primary care physician        Hypertension education Continue to monitor blood pressure. If blood pressure remains elevated contact primary care         Assessment/Plan # Detail Type Description   1. Assessment Body mass index (BMI) 34.0-34.9, adult (Z68.34).   Plan Orders Today's instructions / counseling include(s) Dietary management education, guidance, and  counseling.       2. Assessment Elevated blood-pressure reading, w/o diagnosis of htn (R03.0).           Pain Management Plan Pain Scale: 0/10. Method: Numeric Pain Intensity Scale. Location: neck. Onset: 08/30/2016. Duration: varies. Quality: discomforting. Pain management follow-up plan of care: Patient is taking OTC pain relievers for relief..  Advised patient to refer to his cardiologist for permission to come off of Plavix for surgery. Schedule C3-4 ACDF. Nurse education given. Fit for soft collar.  Orders: Instruction(s)/Education: Assessment Instruction  R03.0 Hypertension education  [phone] Dietary management education, guidance, and counseling             Provider:  Venetia MaxonStern MD, Danae OrleansJoseph D 11/30/2016 10:45 AM  Dictation edited by: Lajoyce LauberLisa Hoyt    CC Providers: Nicolasa DuckingWilliam  Garlick [address] [place]  [phone]-   Lurena Joinerebecca Tat  [address] [place], [place] [id number]              Electronically signed by Danae OrleansJoseph D. Venetia MaxonStern MD on 12/03/2016 08:12 PM

## 2017-01-05 NOTE — Interval H&P Note (Signed)
History and Physical Interval Note:  01/05/2017 12:22 PM  Colin Barker  has presented today for surgery, with the diagnosis of Cervical stenosis of spinal canal  The various methods of treatment have been discussed with the patient and family. After consideration of risks, benefits and other options for treatment, the patient has consented to  Procedure(s) with comments: C3-4 Anterior cervical decompression/discectomy/fusion (N/A) - C3-4 Anterior cervical decompression/discectomy/fusion as a surgical intervention .  The patient's history has been reviewed, patient examined, no change in status, stable for surgery.  I have reviewed the patient's chart and labs.  Questions were answered to the patient's satisfaction.     Taha Dimond D

## 2017-01-05 NOTE — Op Note (Signed)
01/05/2017  3:11 PM  PATIENT:  Colin Barker  76 y.o. 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  PROCEDURE:  Procedure(s) with comments: C3-4 Anterior cervical decompression/discectomy/fusion (N/A) - C3-4 Anterior cervical decompression/discectomy/fusion with PEEK cage, autograft, plate  SURGEON:  Surgeon(s) and Role:    * Lilianah Buffin, MD - Primary    * Nundkumar, Neelesh, MD - Assisting  PHYSICIAN ASSISTANT:   ASSISTANTS: Nundkumar   ANESTHESIA:   general  EBL:  Total I/O In: 800 [I.V.:800] Out: 100 [Blood:100]  BLOOD ADMINISTERED:none  DRAINS: none   LOCAL MEDICATIONS USED:  MARCAINE    and LIDOCAINE   SPECIMEN:  No Specimen  DISPOSITION OF SPECIMEN:  N/A  COUNTS:  YES  TOURNIQUET:  * No tourniquets in log *  DICTATION: DICTATION: Patient is 76 year old male with stenosis of C3 on 4 with myelopathy, cord compression, HNP.  It was elected to take him to surgery for anterior cervical decompression and fusion C 34 level.  PROCEDURE: Patient was brought to operating room and following the smooth and uncomplicated induction of general endotracheal anesthesia his head was placed on a horseshoe head holder he was placed in 5 pounds of Holter traction and his anterior neck was prepped and draped in usual sterile fashion. An incision was made on the left side of midline after infiltrating the skin and subcutaneous tissues with local lidocaine. The platysmal layer was incised and subplatysmal dissection was performed exposing the anterior border sternocleidomastoid muscle. Using blunt dissection the carotid sheath was kept lateral and trachea and esophagus kept medial exposing the anterior cervical spine. A bent spinal needle was placed it was felt to be the C 34 level and this was  confirmed on intraoperative x-ray. Longus coli muscles were taken down from the anterior cervical spine using electrocautery and key elevator and self-retaining retractor was placed exposing the C 34 level. The interspace was incised and a thorough discectomy was performed. Distraction pins were placed. Uncinate spurs and central spondylitic ridges were drilled down with a high-speed drill. The spinal cord dura and both C4 nerve roots were widely decompressed. Hemostasis was assured. After trial sizing a 6 mm lordotic lordotic PEEK cage was selected and packed with local autograft. This was tamped into position and countersunk appropriately. Distraction weight was removed. A 12 mm Aviator anterior cervical plate was affixed to the cervical spine with 14 mm variable-angle screws 2 at C3, 2 at C4. All screws were well-positioned and locking mechanisms were engaged. A final X ray was obtained which showed well positioned graft and anterior plate without complicating features. Soft tissues were inspected and found to be in good repair. The wound was irrigated. The platysma layer was closed with 3-0 Vicryl stitches and the skin was reapproximated with 3-0 Vicryl subcuticular stitches. The wound was dressed with Dermabond. Counts were correct at the end of the case. Patient was extubated and taken to recovery in stable and satisfactory condition.   PLAN OF CARE: Admit to inpatient   PATIENT DISPOSITION:  PACU - hemodynamically stable.   Delay start of Pharmacological VTE agent (>24hrs) due to surgical blood loss or risk of bleeding: yes  

## 2017-01-05 NOTE — Evaluation (Addendum)
Physical Therapy Evaluation and Discharge Patient Details Name: Colin LandauBobby J Barker MRN: [id number] DOB: [birth year] Today's Date: 01/05/2017   History of Present Illness  Pt is a 76 y/o male s/p C3-4 ACDF. PMH includes CAD s/p stent placement and essential tremor.  Clinical Impression  Patient evaluated by Physical Therapy with no further acute PT needs identified. All education has been completed and the patient has no further questions. Pt requiring supervision for mobility and no LOB noted. Demonstrated safe technique during gait training and stair management. Has all necessary DME and assist at home. See below for any follow-up Physical Therapy or equipment needs. PT is signing off. Thank you for this referral. Please re-consult if needs change.      Follow Up Recommendations No PT follow up    Equipment Recommendations  None recommended by PT    Recommendations for Other Services       Precautions / Restrictions Precautions Precautions: Cervical Precaution Comments: REviewed cervical precautions with pt.  Required Braces or Orthoses: Cervical Brace Cervical Brace: Soft collar Restrictions Weight Bearing Restrictions: No      Mobility  Bed Mobility Overal bed mobility: Needs Assistance Bed Mobility: Rolling;Sidelying to Sit;Sit to Sidelying Rolling: Supervision Sidelying to sit: Supervision     Sit to sidelying: Supervision General bed mobility comments: Supervision for safety. Verbal cues for log roll technique.   Transfers Overall transfer level: Needs assistance Equipment used: None Transfers: Sit to/from Stand Sit to Stand: Supervision         General transfer comment: Supervision for safety   Ambulation/Gait Ambulation/Gait assistance: Supervision Ambulation Distance (Feet): 125 Feet Assistive device: None Gait Pattern/deviations: Step-through pattern;Decreased stride length;Trunk flexed Gait velocity: Decreased Gait velocity interpretation: Below normal  speed for age/gender General Gait Details: Slow, overall steady gait. No LOB noted. Supervision for safety. Verbal cues for upright posture.   Stairs Stairs: Yes Stairs assistance: Supervision Stair Management: One rail Left;Step to pattern;Forwards Number of Stairs: 1 General stair comments: Supervision for safety. Demonstrated safe step to technique with stair management.   Wheelchair Mobility    Modified Rankin (Stroke Patients Only)       Balance Overall balance assessment: Needs assistance Sitting-balance support: No upper extremity supported;Feet supported Sitting balance-Leahy Scale: Good     Standing balance support: No upper extremity supported;During functional activity Standing balance-Leahy Scale: Good                               Pertinent Vitals/Pain Pain Assessment: 0-10 Pain Score: 2  Pain Location: neck  Pain Descriptors / Indicators: Aching;Operative site guarding Pain Intervention(s): Limited activity within patient's tolerance;Monitored during session;Repositioned    Home Living Family/patient expects to be discharged to:: Private residence Living Arrangements: Spouse/significant other Available Help at Discharge: Family;Available 24 hours/day Type of Home: House Home Access: Stairs to enter Entrance Stairs-Rails: Left Entrance Stairs-Number of Steps: 1 Home Layout: One level Home Equipment: Walker - 2 wheels;Cane - quad;Shower seat      Prior Function Level of Independence: Independent               Hand Dominance        Extremity/Trunk Assessment   Upper Extremity Assessment Upper Extremity Assessment: Defer to OT evaluation    Lower Extremity Assessment Lower Extremity Assessment: Overall WFL for tasks assessed    Cervical / Trunk Assessment Cervical / Trunk Assessment: Other exceptions Cervical / Trunk Exceptions: s/p ACDF  Communication  Communication: No difficulties  Cognition Arousal/Alertness:  Awake/alert Behavior During Therapy: WFL for tasks assessed/performed Overall Cognitive Status: Within Functional Limits for tasks assessed                                        General Comments General comments (skin integrity, edema, etc.): Pt's wife present during session. Educated about generalized walking program to perform at home.     Exercises     Assessment/Plan    PT Assessment Patent does not need any further PT services  PT Problem List         PT Treatment Interventions      PT Goals (Current goals can be found in the Care Plan section)  Acute Rehab PT Goals Patient Stated Goal: to go home  PT Goal Formulation: With patient Time For Goal Achievement: 01/05/17 Potential to Achieve Goals: Good    Frequency     Barriers to discharge        Co-evaluation               AM-PAC PT "6 Clicks" Daily Activity  Outcome Measure Difficulty turning over in bed (including adjusting bedclothes, sheets and blankets)?: None Difficulty moving from lying on back to sitting on the side of the bed? : None Difficulty sitting down on and standing up from a chair with arms (e.g., wheelchair, bedside commode, etc,.)?: None Help needed moving to and from a bed to chair (including a wheelchair)?: None Help needed walking in hospital room?: None Help needed climbing 3-5 steps with a railing? : None 6 Click Score: 24    End of Session Equipment Utilized During Treatment: Gait belt;Cervical collar Activity Tolerance: Patient tolerated treatment well Patient left: in bed;with call bell/phone within reach;with family/visitor present Nurse Communication: Mobility status PT Visit Diagnosis: Other abnormalities of gait and mobility (R26.89);Pain Pain - part of body:  (neck )    Time: 4540-9811 PT Time Calculation (min) (ACUTE ONLY): 13 min   Charges:   PT Evaluation $PT Eval Low Complexity: 1 Low     PT G Codes:        Colin Barker, PT, DPT   Acute Rehabilitation Services  Pager: [phone]   Colin Barker 01/05/2017, 6:14 PM

## 2017-01-05 NOTE — Anesthesia Procedure Notes (Signed)
Procedure Name: Intubation Date/Time: 01/05/2017 1:31 PM Performed by: Reine JustFLOWERS, Colin Barker Pre-anesthesia Checklist: Patient identified, Emergency Drugs available, Suction available and Patient being monitored Patient Re-evaluated:Patient Re-evaluated prior to induction Oxygen Delivery Method: Circle system utilized Preoxygenation: Pre-oxygenation with 100% oxygen Induction Type: IV induction Ventilation: Mask ventilation without difficulty Laryngoscope Size: Glidescope and 3 Grade View: Grade I Tube type: Oral Tube size: 7.5 mm Number of attempts: 1 Airway Equipment and Method: Patient positioned with wedge pillow,  Stylet and Video-laryngoscopy Placement Confirmation: ETT inserted through vocal cords under direct vision,  positive ETCO2 and breath sounds checked- equal and bilateral Secured at: 22 cm Tube secured with: Tape Dental Injury: Teeth and Oropharynx as per pre-operative assessment  Difficulty Due To: Difficult Airway- due to reduced neck mobility

## 2017-01-05 NOTE — Anesthesia Preprocedure Evaluation (Addendum)
Anesthesia Evaluation  Patient identified by MRN, date of birth, ID band Patient awake    Reviewed: Allergy & Precautions, NPO status , Patient's Chart, lab work & pertinent test results  Airway Mallampati: II  TM Distance: >3 FB Neck ROM: Limited    Dental  (+) Dental Advisory Given   Pulmonary neg pulmonary ROS,    breath sounds clear to auscultation       Cardiovascular hypertension, Pt. on medications + CAD   Rhythm:Regular Rate:Normal     Neuro/Psych negative neurological ROS     GI/Hepatic negative GI ROS, Neg liver ROS,   Endo/Other  Hypothyroidism   Renal/GU negative Renal ROS     Musculoskeletal  (+) Arthritis ,   Abdominal   Peds  Hematology negative hematology ROS (+)   Anesthesia Other Findings   Reproductive/Obstetrics                            Lab Results  Component Value Date   WBC 6.2 12/29/2016   HGB 13.4 12/29/2016   HCT 40.8 12/29/2016   MCV 97.6 12/29/2016   PLT 178 12/29/2016   Lab Results  Component Value Date   CREATININE 1.14 12/29/2016   BUN 22 (H) 12/29/2016   NA 139 12/29/2016   K 4.4 12/29/2016   CL 107 12/29/2016   CO2 24 12/29/2016    Anesthesia Physical Anesthesia Plan  ASA: III  Anesthesia Plan: General   Post-op Pain Management:    Induction: Intravenous  PONV Risk Score and Plan: 2 and Ondansetron and Dexamethasone  Airway Management Planned: Oral ETT and Video Laryngoscope Planned  Additional Equipment:   Intra-op Plan:   Post-operative Plan: Extubation in OR  Informed Consent: I have reviewed the patients History and Physical, chart, labs and discussed the procedure including the risks, benefits and alternatives for the proposed anesthesia with the patient or authorized representative who has indicated his/her understanding and acceptance.   Dental advisory given  Plan Discussed with: CRNA  Anesthesia Plan Comments:         Anesthesia Quick Evaluation

## 2017-01-05 NOTE — Transfer of Care (Signed)
Immediate Anesthesia Transfer of Care Note  Patient: Colin Barker  Procedure(s) Performed: Procedure(s) with comments: C3-4 Anterior cervical decompression/discectomy/fusion (N/A) - C3-4 Anterior cervical decompression/discectomy/fusion  Patient Location: PACU  Anesthesia Type:General  Level of Consciousness: awake, alert  and oriented  Airway & Oxygen Therapy: Patient Spontanous Breathing and Patient connected to nasal cannula oxygen  Post-op Assessment: Report given to RN, Post -op Vital signs reviewed and stable and Patient moving all extremities  Post vital signs: Reviewed and stable  Last Vitals:  Vitals:   01/05/17 1023 01/05/17 1512  BP: 120/63 (!) 154/93  Pulse: 65 82  Resp: 20 12  Temp: (!) 36.3 C (!) 36.4 C  SpO2: 100% 95%    Last Pain:  Vitals:   01/05/17 1512  TempSrc:   PainSc: (P) 0-No pain      Patients Stated Pain Goal: 3 (01/05/17 1033)  Complications: No apparent anesthesia complications

## 2017-01-06 ENCOUNTER — Encounter (HOSPITAL_COMMUNITY): Payer: Self-pay | Admitting: Neurosurgery

## 2017-01-06 DIAGNOSIS — M5001 Cervical disc disorder with myelopathy,  high cervical region: Secondary | ICD-10-CM | POA: Diagnosis not present

## 2017-01-06 MED ORDER — METHOCARBAMOL 500 MG PO TABS: 500.0000 mg | ORAL_TABLET | Freq: Four times a day (QID) | ORAL | 1 refills | Status: DC | PRN

## 2017-01-06 MED ORDER — HYDROCODONE-ACETAMINOPHEN 5-325 MG PO TABS: 1.0000 | ORAL_TABLET | ORAL | 0 refills | Status: DC | PRN

## 2017-01-06 NOTE — Care Management Obs Status (Signed)
MEDICARE OBSERVATION STATUS NOTIFICATION   Patient Details  Name: Colin Barker MRN: [id number] Date of Birth: 1941-02-24   Medicare Observation Status Notification Given:  Yes    Lawerance Sabalebbie Ethal Gotay, RN 01/06/2017, 9:05 AM

## 2017-01-06 NOTE — Anesthesia Postprocedure Evaluation (Signed)
Anesthesia Post Note  Patient: Colin Barker  Procedure(s) Performed: Procedure(s) (LRB): C3-4 Anterior cervical decompression/discectomy/fusion (N/A)     Patient location during evaluation: PACU Anesthesia Type: General Level of consciousness: awake and alert Pain management: pain level controlled Vital Signs Assessment: post-procedure vital signs reviewed and stable Respiratory status: spontaneous breathing, nonlabored ventilation, respiratory function stable and patient connected to nasal cannula oxygen Cardiovascular status: blood pressure returned to baseline and stable Postop Assessment: no signs of nausea or vomiting Anesthetic complications: no    Last Vitals:  Vitals:   01/05/17 2348 01/06/17 0400  BP: 132/65 136/77  Pulse: (!) 59 78  Resp: 20 18  Temp: 36.8 C 36.7 C  SpO2: 94% 95%    Last Pain:  Vitals:   01/06/17 0619  TempSrc:   PainSc: 8    Pain Goal: Patients Stated Pain Goal: 2 (01/06/17 0619)               Kennieth RadFitzgerald, Colin Barker

## 2017-01-06 NOTE — Discharge Summary (Signed)
Physician Discharge Summary  Patient ID: Colin LandauBobby J Barker MRN: [id number] DOB/AGE: 08/29/1940 76 y.o.  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 Following uncomplicated ACDF C3-4, he recovered well and transferred to [facility] for nursing care and therapies. He is mobilizing nicely.    Consults: None  Significant Diagnostic Studies: radiology: X-Ray: intra-op  Treatments: surgery: C3-4 Anterior cervical decompression/discectomy/fusion (N/A) - C3-4 Anterior cervical decompression/discectomy/fusion with PEEK cage, autograft, plate     Discharge Exam: Blood pressure 127/65, pulse 69, temperature 97.8 F (36.6 C), temperature source Oral, resp. rate 18, height 6' (1.829 m), weight 108.9 kg (240 lb), SpO2 99 %. Alert, conversant, working with OT. Reports no pain at present. Markedly improved BUE strength including hand intrinsics. Incision flat without erythema or drainage beneath Dermabond and honeycomb drsg.      Disposition: Discharge to home.  Pt verbalizes understanding of d/c instructions and agrees to call office to schedule f/u appt. Rx's for Norco 5/325 and Robaxin 500mg  to chart for prn home use.      Discharge Instructions    Diet - low sodium heart healthy    Complete by:  As directed    Increase activity slowly    Complete by:  As directed      Allergies as of 01/06/2017      Reactions   Rosuvastatin    Other reaction(s): MUSCLE PAIN      Medication List    STOP taking these medications   clopidogrel 75 MG tablet Commonly known as:  PLAVIX     TAKE these medications   acetaminophen 500 MG tablet Commonly known as:  TYLENOL Take 1,000 mg by mouth every 6 (six) hours as needed for mild pain.   aspirin EC 81 MG tablet Take 81 mg by mouth at bedtime.   cetirizine 10 MG tablet Commonly known as:  ZYRTEC Take 10 mg by mouth daily.   cholecalciferol 1000 units tablet Commonly known as:  VITAMIN D Take 2,000 Units by mouth at bedtime.   cyanocobalamin 1000 MCG/ML injection Commonly known as:  (VITAMIN B-12) Inject 1,000 mcg into the skin every 30 (thirty) days.   HYDROcodone-acetaminophen 5-325 MG tablet Commonly known as:  NORCO/VICODIN Take 1-2 tablets by mouth every 4 (four) hours as needed (breakthrough pain).   isosorbide mononitrate 30 MG 24 hr tablet Commonly known as:  IMDUR Take 30 mg by mouth daily.   levothyroxine 100 MCG tablet Commonly known as:  SYNTHROID, LEVOTHROID Take 50-100 mcg by mouth daily before breakfast. Takes 50mcg daily Mon-Fri and 100mcg daily on Sat and Sun only.   methocarbamol 500 MG tablet Commonly known as:  ROBAXIN Take 1 tablet (500 mg total) by mouth every 6 (six) hours as needed for muscle spasms.   omeprazole 40 MG capsule Commonly known as:  PRILOSEC Take 40 mg by mouth daily before breakfast.   primidone 50 MG tablet Commonly known as:  MYSOLINE Take 1 tablet (50 mg total) by mouth 2 (two) times daily.   simvastatin 40  MG tablet Commonly known as:  ZOCOR Take 40 mg by mouth at bedtime.            Discharge Care Instructions        Start     Ordered   01/06/17 0000  HYDROcodone-acetaminophen (NORCO/VICODIN) 5-325 MG tablet  Every 4 hours PRN     01/06/17 0621   01/06/17 0000  methocarbamol (ROBAXIN) 500 MG tablet  Every 6 hours PRN     01/06/17 0621   01/06/17 0000  Increase activity slowly      01/06/17 0621   01/06/17 0000  Diet - low sodium heart healthy     01/06/17 1610       Signed: Georgiann Cocker 01/06/2017, 8:23 AM

## 2017-01-06 NOTE — Care Management CC44 (Signed)
Condition Code 44 Documentation Completed  Patient Details  Name: Nadeen LandauBobby J Bonaventure MRN: [id number] Date of Birth: [birth year]   Condition Code 44 given:  Yes Patient signature on Condition Code 44 notice:  Yes Documentation of 2 MD's agreement:  Yes Code 44 added to claim:  Yes    Lawerance Sabalebbie Janazia Schreier, RN 01/06/2017, 9:05 AM

## 2017-01-06 NOTE — Care Management Note (Signed)
Case Management Note  Patient Details  Name: Colin Barker MRN: [id number] Date of Birth: 1941-04-13  Subjective/Objective:                 Spoke w patient and wife at bedside, reviewed CC44, discussed home needs, none identified. Plan for DC this AM.   Action/Plan:   Expected Discharge Date:  01/06/17               Expected Discharge Plan:  Home/Self Care  In-House Referral:     Discharge planning Services  CM Consult  Post Acute Care Choice:    Choice offered to:     DME Arranged:    DME Agency:     HH Arranged:    HH Agency:     Status of Service:  Completed, signed off  If discussed at [employer] Length of Stay Meetings, dates discussed:    Additional Comments:  Lawerance SabalDebbie Claudell Rhody, RN 01/06/2017, 9:06 AM

## 2017-01-06 NOTE — Discharge Instructions (Signed)

## 2017-01-06 NOTE — Evaluation (Addendum)
Occupational Therapy Evaluation Patient Details Name: Colin Barker MRN: [id number] DOB: January 14, 1941 Today's Date: 01/06/2017    History of Present Illness Pt is a 76 y/o male s/p C3-4 ACDF. PMH includes CAD s/p stent placement and essential tremor.   Clinical Impression   PTA, pt was independent with ADL and functional mobility. Pt does have history of previous injury impacting stability during functional mobility and typically wears lift shoe (not present during session). He currently requires min guard assist for safety with toilet and shower transfers and demonstrated increasing fatigue as distance increased. Educated pt and wife concerning cervical precautions during ADL and functional mobility to maximize independence and safety during recovery. He would benefit from continued OT services while admitted to improve independence prior to D/C home with no OT follow-up and 24 hour assistance.     Follow Up Recommendations  No OT follow up;Supervision/Assistance - 24 hour    Equipment Recommendations  None recommended by OT (has needs met)   Recommendations for Other Services       Precautions / Restrictions Precautions Precautions: Cervical Precaution Comments: Reviewed cervical precautions with pt Required Braces or Orthoses: Cervical Brace Cervical Brace: Soft collar;At all times;Other (comment) (All times except showering) Restrictions Weight Bearing Restrictions: No      Mobility Bed Mobility Overal bed mobility: Needs Assistance Bed Mobility: Rolling;Sidelying to Sit;Sit to Sidelying Rolling: Supervision Sidelying to sit: Supervision     Sit to sidelying: Supervision General bed mobility comments: Supervision for safety. Verbal cues for log roll technique.   Transfers Overall transfer level: Needs assistance Equipment used: None Transfers: Sit to/from Stand Sit to Stand: Supervision         General transfer comment: Supervision for safety     Balance  Overall balance assessment: Needs assistance Sitting-balance support: No upper extremity supported;Feet supported Sitting balance-Leahy Scale: Good     Standing balance support: No upper extremity supported;During functional activity;Single extremity supported Standing balance-Leahy Scale: Fair Standing balance comment: When not wearing built-up shoe, slightly unstable during functional mobility.                            ADL either performed or assessed with clinical judgement   ADL Overall ADL's : Needs assistance/impaired Eating/Feeding: Set up;Sitting   Grooming: Supervision/safety;Standing   Upper Body Bathing: Sitting;Minimal assistance;With caregiver independent assisting   Lower Body Bathing: Sit to/from stand;Supervison/ safety   Upper Body Dressing : Sitting;Minimal assistance Upper Body Dressing Details (indicate cue type and reason): Min assist for donning soft collar. Educated wife on methods for donning/doffing and cleaning.  Lower Body Dressing: Supervision/safety;Sit to/from stand   Toilet Transfer: Min guard;Ambulation;BSC Toilet Transfer Details (indicate cue type and reason): BSC over toilet. Pt reports handicapped height toilet at home. As distance increased, pt becoming more fatigued.  Toileting- Clothing Manipulation and Hygiene: Supervision/safety;Sit to/from stand   Tub/ Shower Transfer: Supervision/safety;Ambulation;Walk-in shower;Grab bars;Shower seat   Functional mobility during ADLs: Min guard General ADL Comments: Educated pt and wife concerning brace wear schedule and cleaning methods. Additionally educated on no reaching overhead and strategies to avoid this during UB dressing tasks. Additionally educated on precautions post ACDF related to ADL participation. Min guard assist throughout functional mobility due to pt slightly unstable as he became fatigued. From previous accident, pt with list to the R without built up shoe on.      Vision  Patient Visual Report: No change from baseline Vision Assessment?: No apparent visual  deficits     Perception     Praxis      Pertinent Vitals/Pain Pain Assessment: 0-10 Pain Score: 1  Pain Location: neck  Pain Descriptors / Indicators: Aching;Operative site guarding Pain Intervention(s): Limited activity within patient's tolerance;Monitored during session;Repositioned     Hand Dominance     Extremity/Trunk Assessment Upper Extremity Assessment Upper Extremity Assessment: Overall WFL for tasks assessed   Lower Extremity Assessment Lower Extremity Assessment: Overall WFL for tasks assessed   Cervical / Trunk Assessment Cervical / Trunk Assessment: Other exceptions Cervical / Trunk Exceptions: s/p ACDF   Communication Communication Communication: No difficulties   Cognition Arousal/Alertness: Awake/alert Behavior During Therapy: WFL for tasks assessed/performed Overall Cognitive Status: Within Functional Limits for tasks assessed                                     General Comments  Pt's wife present during session and demonstrates the ability to provide assistance with ADL.     Exercises     Shoulder Instructions      Home Living Family/patient expects to be discharged to:: Private residence Living Arrangements: Spouse/significant other Available Help at Discharge: Family;Available 24 hours/day Type of Home: House Home Access: Stairs to enter [employer] of Steps: 1 Entrance Stairs-Rails: Left Home Layout: One level     Bathroom Shower/Tub: Occupational psychologist: Handicapped height     Home Equipment: Environmental consultant - 2 wheels;Cane - quad;Shower seat          Prior Functioning/Environment Level of Independence: Independent                 OT Problem List: Decreased activity tolerance;Impaired balance (sitting and/or standing);Decreased safety awareness;Decreased knowledge of use of DME or AE;Decreased knowledge of  precautions;Pain      OT Treatment/Interventions: Self-care/ADL training;Therapeutic exercise;Energy conservation;DME and/or AE instruction;Therapeutic activities;Patient/family education;Balance training    OT Goals(Current goals can be found in the care plan section) Acute Rehab OT Goals Patient Stated Goal: to go home  OT Goal Formulation: With patient Time For Goal Achievement: 01/13/17 Potential to Achieve Goals: Good ADL Goals Pt Will Perform Grooming: with modified independence;standing Pt Will Perform Upper Body Dressing: with modified independence;sitting Pt Will Perform Lower Body Dressing: with modified independence;sit to/from stand Pt Will Transfer to Toilet: with modified independence;ambulating (handicapped height toilet) Pt Will Perform Toileting - Clothing Manipulation and hygiene: with modified independence;sit to/from stand  OT Frequency: Min 2X/week   Barriers to D/C:            Co-evaluation              AM-PAC PT "6 Clicks" Daily Activity     Outcome Measure Help from another person eating meals?: None Help from another person taking care of personal grooming?: A Little Help from another person toileting, which includes using toliet, bedpan, or urinal?: A Little Help from another person bathing (including washing, rinsing, drying)?: A Little Help from another person to put on and taking off regular upper body clothing?: A Little Help from another person to put on and taking off regular lower body clothing?: A Little 6 Click Score: 19   End of Session Equipment Utilized During Treatment: Gait belt;Cervical collar Nurse Communication: Mobility status  Activity Tolerance: Patient tolerated treatment well Patient left: in bed;with family/visitor present;with nursing/sitter in room (sitting at EOB with RN present)  OT Visit Diagnosis: Unsteadiness on  feet (R26.81);Muscle weakness (generalized) (M62.81)                Time: 4695-0722 OT Time  Calculation (min): 17 min Charges:  OT General Charges $OT Visit: 1 Visit OT Evaluation $OT Eval Moderate Complexity: 1 Mod G-Codes:     Norman Herrlich, MS OTR/L  Pager: [place] A Laylani Pudwill 01/06/2017, 9:08 AM

## 2017-01-06 NOTE — Progress Notes (Signed)
Subjective: Patient reports "I'm doing pretty good. I'm not hurting anywhere"  Objective: Vital signs in last 24 hours: Temp:  [97.4 F (36.3 C)-98.4 F (36.9 C)] 97.8 F (36.6 C) (09/12 0806) Pulse Rate:  [59-93] 69 (09/12 0806) Resp:  [12-20] 18 (09/12 0806) BP: (120-154)/(63-93) 127/65 (09/12 0806) SpO2:  [94 %-100 %] 99 % (09/12 0806) Weight:  [108.9 kg (240 lb)] 108.9 kg (240 lb) (09/11 1023)  Intake/Output from previous day: 09/11 0701 - 09/12 0700 In: 1955 [P.O.:1080; I.V.:800] Out: 100 [Blood:100] Intake/Output this shift: No intake/output data recorded.  Alert, conversant, working with OT. Reports no pain at present. Markedly improved BUE strength including hand intrinsics. Incision flat without erythema or drainage beneath Dermabond and honeycomb drsg.   Lab Results: No results for input(s): WBC, HGB, HCT, PLT in the last 72 hours. BMET No results for input(s): NA, K, CL, CO2, GLUCOSE, BUN, CREATININE, CALCIUM in the last 72 hours.  Studies/Results: Dg Cervical Spine 2-3 Views  Result Date: 01/05/2017 CLINICAL DATA:  C3-C4 ACDF. EXAM: CERVICAL SPINE - 2-3 VIEW COMPARISON:  Cervical spine MRI dated Sep 20, 2016. FINDINGS: Sequential intraoperative lateral views demonstrates surgical instrumentation at the level of C3-C4 disc space followed by C3-C4 ACDF. The bones are diffusely osteopenic. IMPRESSION: Interval C3-C4 ACDF. FLUOROSCOPY TIME:  C-arm fluoroscopic images were obtained intraoperatively and submitted for post operative interpretation. Please see the performing provider's procedural report for the fluoroscopy time utilized. Electronically Signed   By: Obie DredgeWilliam T Derry M.D.   On: 01/05/2017 15:19    Assessment/Plan: Improved  LOS: 1 day  Per DrStern, d/c IV, d/c to home. Pt verbalizes understanding of d/c instructions and agrees to call office to schedule f/u appt. Rx's for Norco 5/325 and Robaxin 500mg  to chart for prn home use.    Georgiann Cockeroteat, Edlin Ford 01/06/2017,  8:19 AM

## 2017-01-06 NOTE — Progress Notes (Signed)
Patient alert and oriented, mae's well, voiding adequate amount of urine, swallowing without difficulty, no c/o pain at time of discharge. Patient discharged home with family. Script and discharged instructions given to patient. Patient and family stated understanding of instructions given. Patient has an appointment with Dr.Stern    

## 2017-01-07 LAB — BPAM RBC
Blood Product Expiration Date: 201809202359
Blood Product Expiration Date: 201809202359
ISSUE DATE / TIME: 201808232135
UNIT TYPE AND RH: 9500
Unit Type and Rh: 9500

## 2017-01-07 LAB — TYPE AND SCREEN
ABO/RH(D): O NEG
Antibody Screen: POSITIVE
DAT, IgG: NEGATIVE
DONOR AG TYPE: NEGATIVE
DONOR AG TYPE: NEGATIVE
PT AG Type: NEGATIVE
Unit division: 0
Unit division: 0

## 2017-01-14 NOTE — Progress Notes (Signed)
   01/05/17 1807  PT G-Codes **NOT FOR INPATIENT CLASS**  Functional Assessment Tool Used AM-PAC 6 Clicks Basic Mobility  Functional Limitation Mobility: Walking and moving around  Mobility: Walking and Moving Around Current Status (Z6109) CH  Mobility: Walking and Moving Around Goal Status (U0454) CH  Mobility: Walking and Moving Around Discharge Status [phone]) CH   Inserting G codes  Gladys Damme, PT, DPT  Acute Rehabilitation Services  Pager: [phone]

## 2017-01-15 NOTE — Progress Notes (Addendum)
Late entry for missed G-code for OT evaluation 01-31-2017.    31-Jan-2017 0800  OT G-codes **NOT FOR INPATIENT CLASS**  Functional Assessment Tool Used Clinical judgement  Functional Limitation Self care  Self Care Current Status [phone]) CI  Self Care Goal Status (U0454) CH   Thank you! Doristine Section, MS OTR/L  Pager: [phone]

## 2017-01-19 ENCOUNTER — Telehealth: Payer: Self-pay | Admitting: Neurology

## 2017-01-19 MED ORDER — PRIMIDONE 50 MG PO TABS: ORAL_TABLET | ORAL | 1 refills | Status: DC

## 2017-01-19 NOTE — Telephone Encounter (Signed)
Pt's wife called and said he needs a prescription called in to the mail order pharmacy for Primidone

## 2017-01-19 NOTE — Telephone Encounter (Signed)
RX sent to pharmacy  

## 2017-04-28 NOTE — Progress Notes (Signed)
Colin LandauBobby J Barker was seen today in the movement disorders clinic for neurologic consultation at the request of Colin Barker, William, MD.  The consultation is for the evaluation of tremor.  This patient is accompanied in the office by his wife who supplements the history.   Tremor: Yes.     How long has it been going on? 2 years, getting worse  At rest or with activation?  With use  Fam hx of tremor?  Yes.  , sisters (2) and brother have tremor  Located where?  Started in the L hand but is L hand dominant but has in the right now  Affected by caffeine:  No. (diet coke - 3 cans/day; unsweet tea with meal - 2 glasses)  Affected by alcohol:  No. (3-4 beers/week; used to be heavy drinker but stopped that 3-4 years ago)  Affected by stress:  No.  Affected by fatigue:  Yes.    Spills soup if on spoon:  yes  Spills glass of liquid if full:  Yes.    Affects ADL's (tying shoes, brushing teeth, etc):  Yes.   (has to brush teeth and use razor with both hands   Specific Symptoms:  Voice: no change Sleep: some intermittent trouble staying asleep  Vivid Dreams:  No.  Acting out dreams:  No. Wet Pillows: No. Postural symptoms:  Yes.  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  No. (none in recent years) Bradykinesia symptoms: difficulty getting out of a chair (has to push off) Loss of smell:  Yes.   Loss of taste:  No. Urinary Incontinence:  No. Difficulty Swallowing:  No. Handwriting, micrographia: No. Depression:  No. Memory changes:  No. visual distortions: Yes.   but rarely N/V:  No. Lightheaded:  No.  Syncope: No. Diplopia:  No. Dyskinesia:  No.  09/17/16 update:  Patient seen today in follow-up.  He is accompanied by his wife who supplements the history.  I ordered an MRI of the brain and cervical spine since our last visit.  They were initially denied and had to have a written appeal.   MRI of the cervical spine is subsequently scheduled for 09/20/2016 and MRI of the brain is still being appealed (currently still denied by insurance).  DaT scan was done on 09/15/2016.  This demonstrated normal uptake of the radiotracer.  I personally reviewed the films.    12/29/16 update: Patient seen today in follow-up for essential tremor.  He was started on primidone last visit.  His wife called in July and stated that the medication was helping, but not enough.  The medication was increased to 50 mg, twice daily dosing.  The medication is helpful but he still has trouble shaving.  He has cut out alcohol and has backed down on coke.  He had an MRI of the cervical spine since our last visit.  I ordered this and reviewed it when completed.  There was disc protrusion at the C 3 C4 level with associated cord myelomalacia.  He is scheduled to have cervical spine surgery on 01/05/2017 with Dr. Venetia MaxonStern.  04/29/16 update: Patient seen today in follow-up for his essential tremor.  His primidone was increased last visit so that he is taking 100 mg in the morning and 50 mg at night.  He states that he is doing really good this AM.  When he approximates an object  he will have tremor on the L.  If BS are low, he will have more tremor.  He had cervical spine surgery on January 05, 2017.  He states that he did really well and has no neck pain.  PREVIOUS MEDICATIONS: metoprolol 75 mg started one year ago but no help with tremor.  Started on it by cardiology but for tremor per patient  ALLERGIES:   Allergies  Allergen Reactions  . Rosuvastatin     Other reaction(s): MUSCLE PAIN    CURRENT MEDICATIONS:  Outpatient Encounter Medications as of 04/30/2017  Medication Sig  . acetaminophen (TYLENOL) 500 MG tablet Take 1,000 mg by mouth every 6 (six) hours as needed for mild pain.   Marland Kitchen. aspirin EC 81 MG tablet Take 81 mg by mouth at bedtime.   . cetirizine (ZYRTEC) 10 MG tablet Take 10 mg by mouth daily.  .  cholecalciferol (VITAMIN D) 1000 units tablet Take 2,000 Units by mouth at bedtime.   . cyanocobalamin (,VITAMIN B-12,) 1000 MCG/ML injection Inject 1,000 mcg into the skin every 30 (thirty) days.  . isosorbide mononitrate (IMDUR) 30 MG 24 hr tablet Take 30 mg by mouth daily.  Marland Kitchen. levothyroxine (SYNTHROID, LEVOTHROID) 100 MCG tablet Take 50-100 mcg by mouth daily before breakfast. Takes 50mcg daily Mon-Fri and 100mcg daily on Sat and Sun only.  Marland Kitchen. omeprazole (PRILOSEC) 40 MG capsule Take 40 mg by mouth daily before breakfast.  . primidone (MYSOLINE) 50 MG tablet Two in the morning, one in the evening  . simvastatin (ZOCOR) 40 MG tablet Take 40 mg by mouth at bedtime.   . tamsulosin (FLOMAX) 0.4 MG CAPS capsule TAKE 1 CAPSULE EVERY EVENING AFTER SUPPER  . [DISCONTINUED] HYDROcodone-acetaminophen (NORCO/VICODIN) 5-325 MG tablet Take 1-2 tablets by mouth every 4 (four) hours as needed (breakthrough pain).  . [DISCONTINUED] methocarbamol (ROBAXIN) 500 MG tablet Take 1 tablet (500 mg total) by mouth every 6 (six) hours as needed for muscle spasms.   No facility-administered encounter medications on file as of 04/30/2017.     PAST MEDICAL HISTORY:   Past Medical History:  Diagnosis Date  . BPH (benign prostatic hyperplasia)   . Chronic ischemic heart disease   . Coronary artery disease   . Hyperlipidemia   . Hypothyroidism   . Osteoarthritis   . Osteoarthritis   . Osteoporosis   . Syncope   . Vitamin B12 deficiency    on injections  . Vitamin D deficiency     PAST SURGICAL HISTORY:   Past Surgical History:  Procedure Laterality Date  . ANTERIOR CERVICAL DECOMP/DISCECTOMY FUSION N/A 01/05/2017   Procedure: C3-4 Anterior cervical decompression/discectomy/fusion;  Surgeon: Maeola HarmanStern, Joseph, MD;  [facility];  Service: Neurosurgery;  Laterality: N/A;  C3-4 Anterior cervical decompression/discectomy/fusion  . CARDIAC CATHETERIZATION    . CATARACT EXTRACTION, BILATERAL    . LEG SURGERY Right     x3  . TONSILLECTOMY    . WISDOM TOOTH EXTRACTION      SOCIAL HISTORY:   Social History   Socioeconomic History  . Marital status: Married    Spouse name: Not on file  . Number of children: Not on file  . Years of education: Not on file  . Highest education level: Not on file  Social Needs  . Financial resource strain: Not on file  . Food insecurity - worry: Not on file  . Food insecurity - inability: Not on file  . Transportation needs - medical: Not on file  . Transportation needs - non-medical:  Not on file  Occupational History  . Occupation: retired    Comment: Stage manager  Tobacco Use  . Smoking status: Never Smoker  . Smokeless tobacco: Current User    Types: Chew  Substance and Sexual Activity  . Alcohol use: No  . Drug use: No  . Sexual activity: Not on file  Other Topics Concern  . Not on file  Social History Narrative  . Not on file    FAMILY HISTORY:   Family Status  Relation Name Status  . Mother  Deceased  . Father  Deceased  . Sister  Alive  . Brother  Alive  . Child 2 Alive  . Sister  Deceased    ROS:   A complete 10 system review of systems was obtained and was unremarkable apart from what is mentioned above.  PHYSICAL EXAMINATION:    VITALS:   Vitals:   04/30/17 0855  BP: 128/80  Pulse: 66  SpO2: 98%  Weight: 241 lb (109.3 kg)  Height: 6\' 1"  (1.854 m)    GEN:  The patient appears stated age and is in NAD. HEENT:  Normocephalic, atraumatic.  The mucous membranes are moist. The superficial temporal arteries are without ropiness or tenderness. CV:  RRR Lungs:  CTAB Neck/HEME:  There are no carotid bruits bilaterally.  Neurological examination:  Orientation: The patient is alert and oriented x3.  Cranial nerves: There is good facial symmetry.  The speech is fluent and clear. Soft palate rises symmetrically and there is no tongue deviation. Hearing is intact to conversational  tone. Sensation: Sensation is intact to light and pinprick throughout (facial, trunk, extremities). Vibration is decreased distially. There is no extinction with double simultaneous stimulation. There is no sensory dermatomal level identified. Motor: Strength is 5/5 in the bilateral upper and lower extremities.  Grip strength is markedly improved bilaterally.     Shoulder shrug is equal and symmetric.  There is no pronator drift.  Movement examination: Tone: There is good tone today Abnormal movements: no resting tremor with either hand today  He has mild tremor of outstretched hands that only slightly increase with intention.  When given a weight in the hand, it did not change tremor significantly (same as previous) Coordination:  There is good RAMs today Gait and Station: The patient has mild difficulty arising out of a deep-seated chair without the use of the hands.  .  The patient's stride length is normal with no evidence of re-emergent tremor.  He is stooped.  He is just mildly slow.  He is antalgic because of a short leg from previous surgeries.  Labs: Patient had lab work on 06/30/2016.  I reviewed those.  His white blood cells were 5.6, hemoglobin 13.2, hematocrit 39.2 and platelets 175.  Sodium was 142, potassium 4.3, chloride 103, CO2 21, BUN 20, creatinine 1.04 and glucose 100.  AST 18, ALT 14 and alkaline phosphatase 57.  TSH was 2.770.  ASSESSMENT/PLAN:  1.  Tremor  -DaT scan was negative but he still has some rigidity in the RUE.  Hx is more c/w ET.  Pt/wife shows pics of the DaT scan today and it was reviewed with them.  -continue primidone - 100 mg in the AM, 50 mg at night.  Risks, benefits, side effects and alternative therapies were discussed.  The opportunity to ask questions was given and they were answered to the best of my ability.  The patient expressed understanding and willingness to follow the  outlined treatment protocols.  -Patient feels that d/c metoprolol actually  made tremor better  -I talked to the patient about the logistics associated with DBS therapy.  I talked to the patient about risks/benefits/side effects of DBS therapy.  We talked about risks which included but were not limited to infection, paralysis, intraoperative seizure, death, stroke, bleeding around the electrode.   I talked to patient about fiducial placement 1 week prior to DBS therapy.  I talked to the patient about what to expect in the operating room, including the fact that this is an awake surgery.  We talked about battery placement as well as which is done under general anesthesia, generally approximately one week following the initial surgery.  We also talked about the fact that the patient will need to be off of medications for surgery.  The patient and family were given the opportunity to ask questions, which they did, and I answered them to the best of my ability today.  He is not interested in DBS and is actually doing pretty well on primidone  2.  cervical spinal stenosis with cord myelomalacia  -Patient is status post surgical intervention on January 05, 2017 with Dr. Venetia Maxon.  He is much stronger and feeling much better.  3.  F/u in 8 months, sooner should new issues arise.  Much greater than 50% of this visit was spent in counseling and coordinating care.  Total face to face time:  25 min     Cc:  Colin Ducking, MD

## 2017-04-30 ENCOUNTER — Encounter: Payer: Self-pay | Admitting: Neurology

## 2017-04-30 ENCOUNTER — Ambulatory Visit: Payer: Medicare HMO | Admitting: Neurology

## 2017-04-30 VITALS — BP 128/80 | HR 66 | Ht 73.0 in | Wt 241.0 lb

## 2017-04-30 DIAGNOSIS — G250 Essential tremor: Secondary | ICD-10-CM

## 2017-06-02 ENCOUNTER — Telehealth: Payer: Self-pay | Admitting: Neurology

## 2017-06-02 MED ORDER — PRIMIDONE 50 MG PO TABS: ORAL_TABLET | ORAL | 1 refills | Status: DC

## 2017-06-02 NOTE — Addendum Note (Signed)
Addended bySilvio Pate: Timya Trimmer L on: 06/02/2017 09:13 AM   Modules accepted: Orders

## 2017-06-02 NOTE — Telephone Encounter (Signed)
RX sent to [facility]

## 2017-06-02 NOTE — Telephone Encounter (Signed)
Patient needs the primidone called in to the [facility] in [facility] [place] he takes three a day

## 2017-06-27 IMAGING — MR MR CERVICAL SPINE W/O CM
4 of 5 series · 24 of 48 positions shown · non-contrast
Comparison: None.

CLINICAL DATA: Right-sided neck pain, shoulder pain for 1 year

EXAM:
MRI CERVICAL SPINE WITHOUT CONTRAST
TECHNIQUE: Multiplanar, multisequence MR imaging of the cervical spine was
performed. No intravenous contrast was administered.

[Series 2: T2 · sagittal · 3.5mm · 0.43mm/px · 6 of 12 slices shown (1 of 2)]
[im 1/12]
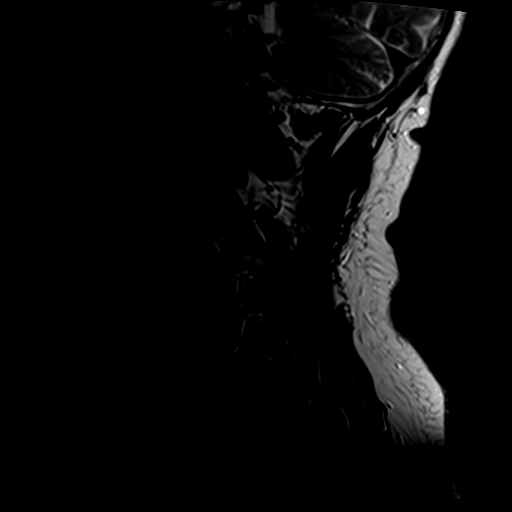
[im 3/12]
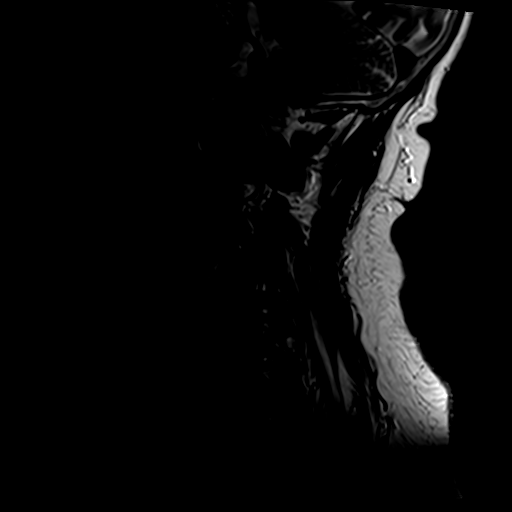
[im 5/12]
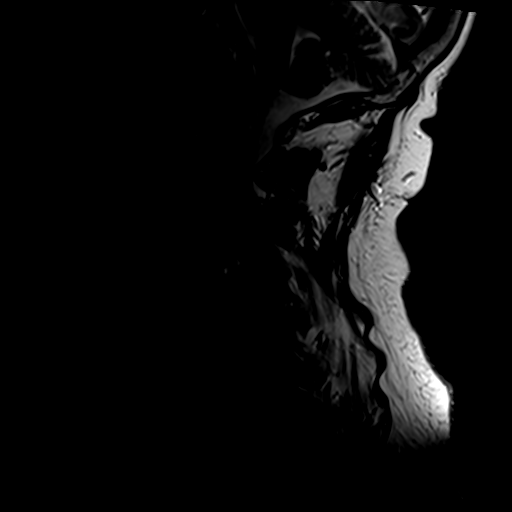
[im 7/12]
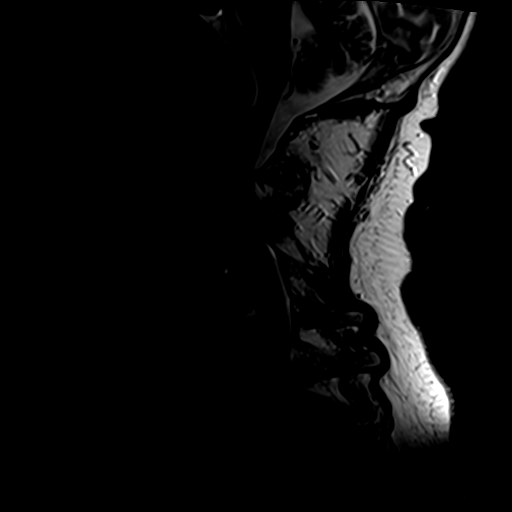
[im 9/12]
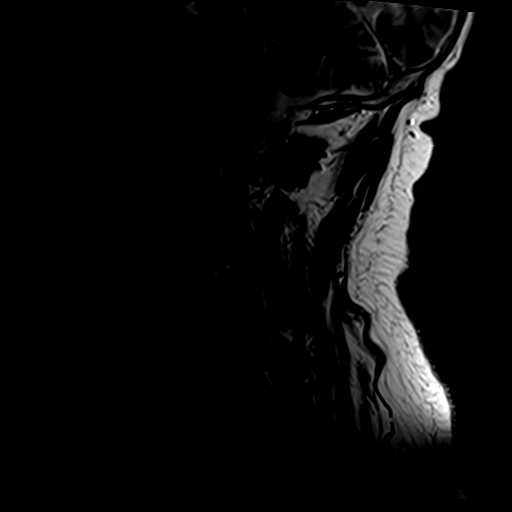
[im 12/12]
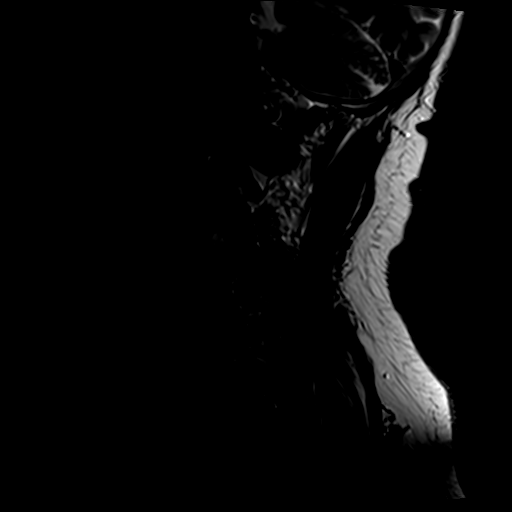

[Series 3: T1 · sagittal · 3.5mm · 0.43mm/px · 6 of 12 slices shown]
[im 1/12]
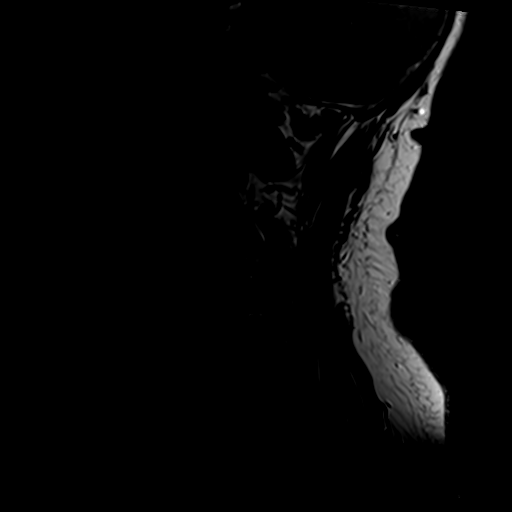
[im 3/12]
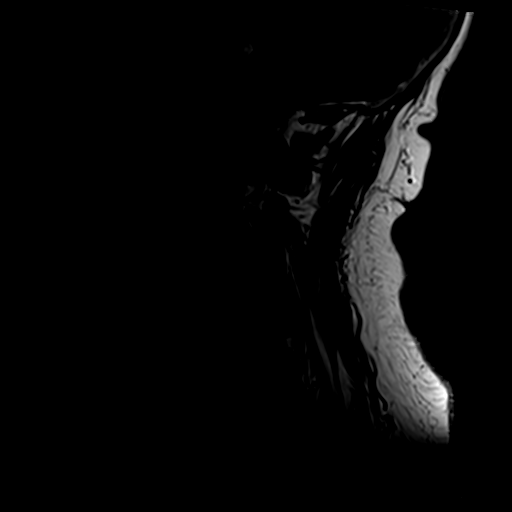
[im 5/12]
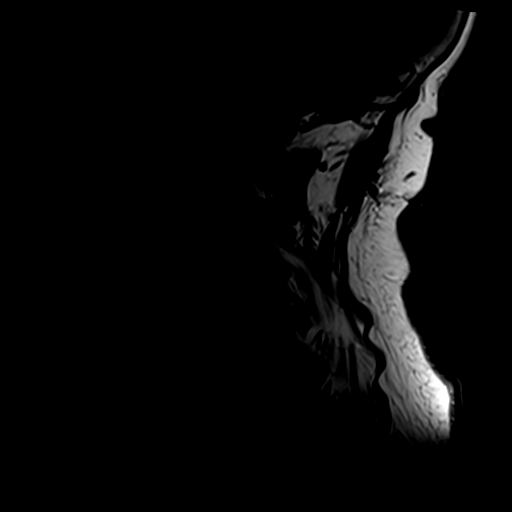
[im 7/12]
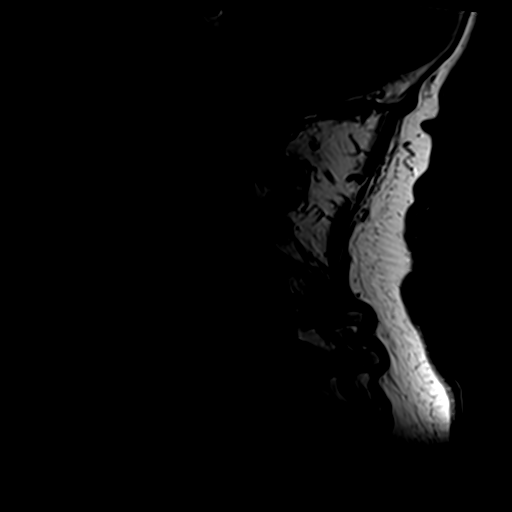
[im 9/12]
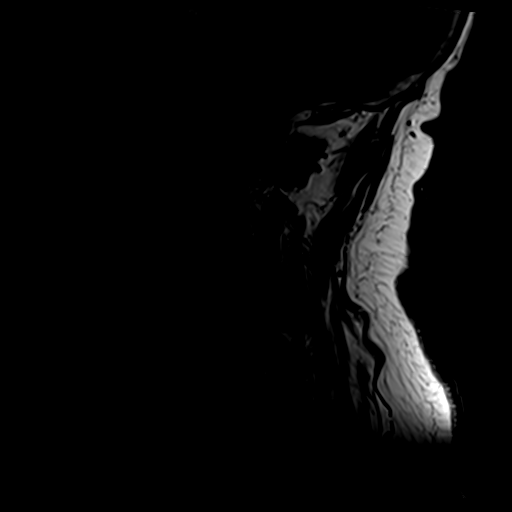
[im 12/12]
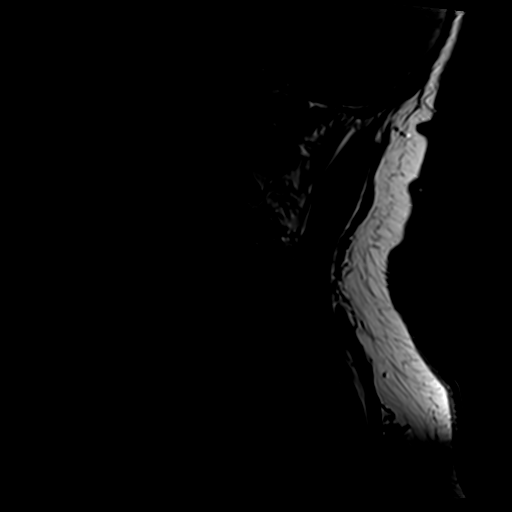

[Series 4: tir sag · sagittal · 3.5mm · 0.43mm/px · 3 of 12 slices shown]
[im 3/12]
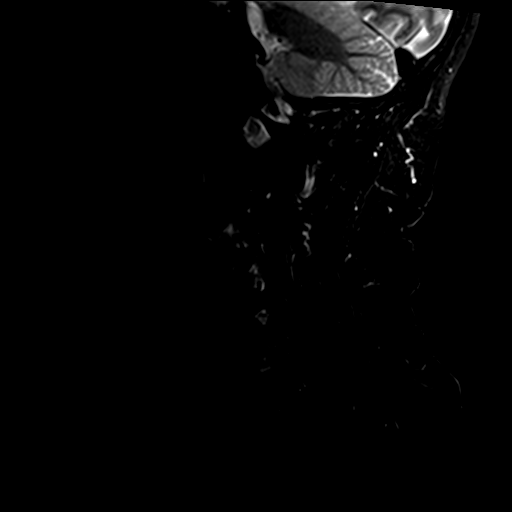
[im 7/12]
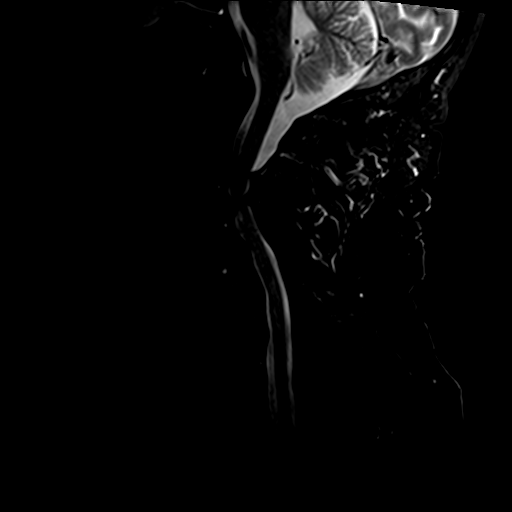
[im 12/12]
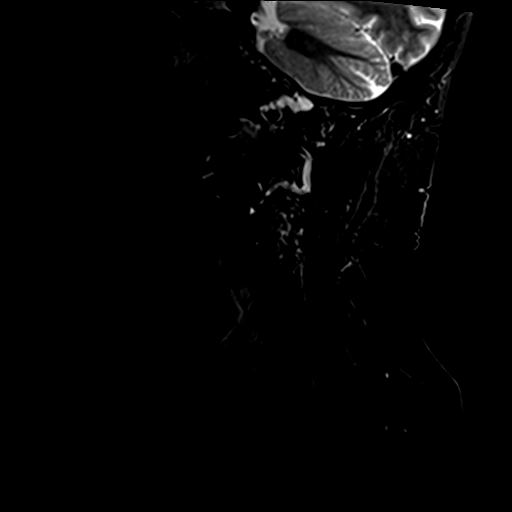

[Series 6: T2 · axial · 3.0mm · 0.70mm/px · z∈[-101,-5]mm · 9 of 28 slices shown (2 of 2)]
[im 1/28]
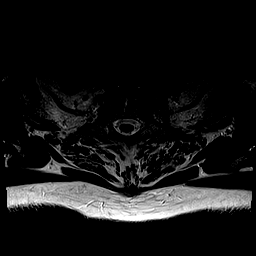
[im 4/28]
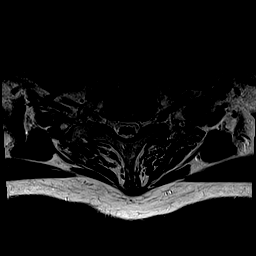
[im 8/28]
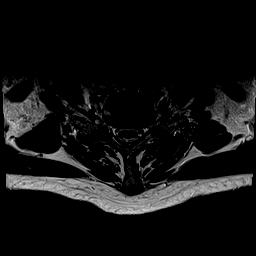
[im 12/28]
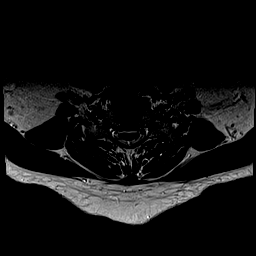
[im 14/28]
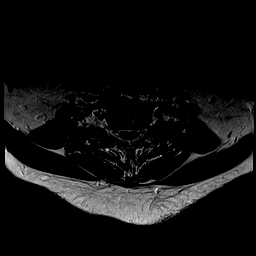
[im 16/28]
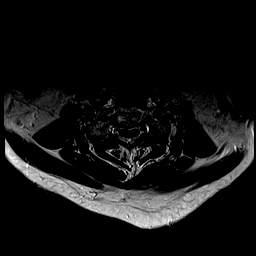
[im 20/28]
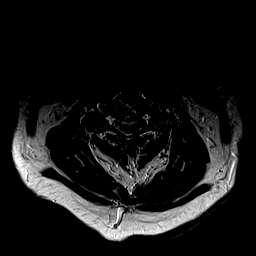
[im 24/28]
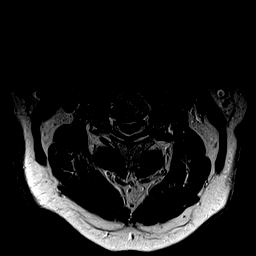
[im 28/28]
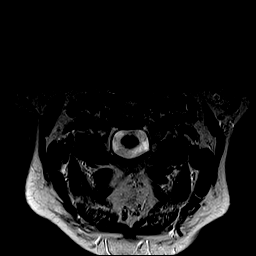

[24 of 48 positions shown; findings below may reference images not displayed]

FINDINGS: Alignment: Also the normal cervical lordosis with mild reversal. 2
mm anterolisthesis of C4 on C5 secondary to facet disease.

Vertebrae: No fracture, evidence of discitis, or bone lesion.

Cord: Tiny focal area of T2 hyperintensity in the cervical spinal
cord of the level of C3-4 likely reflecting mild myelomalacia.

Posterior Fossa, vertebral arteries, paraspinal tissues: No
paraspinal abnormality. Posterior fossa and vertebral arteries
demonstrate no focal abnormality.

Disc levels:

Discs: Degenerative disc disease with disc height loss C2-3, C4-5,
C5-6 and C6-7.

C2-3: Mild broad-based disc osteophyte complex. Mild bilateral facet
arthropathy. Moderate right foraminal stenosis. No left foraminal
stenosis. No central canal stenosis.

C3-4: Broad-based disc osteophyte complex with a central disc
protrusion deforming the ventral cervical spinal cord. Moderate
spinal stenosis. Mild bilateral facet arthropathy. No significant
foraminal stenosis.

C4-5: Tiny central disc protrusion. Bilateral uncovertebral
degenerative changes. No significant foraminal stenosis. Mild
bilateral facet arthropathy. No central canal stenosis.

C5-6: No significant disc bulge. Left uncovertebral degenerative
changes. Mild left foraminal stenosis. No right foraminal stenosis.
No central canal stenosis.

C6-7: No significant disc bulge. Moderate left foraminal stenosis.
No right foraminal stenosis. No central canal stenosis.

C7-T1: No significant disc bulge. No neural foraminal stenosis. No
central canal stenosis.
IMPRESSION: 1. At C3-4 there is a broad-based disc osteophyte complex with a
central disc protrusion deforming the ventral cervical spinal cord.
Moderate spinal stenosis. Mild bilateral facet arthropathy. No
significant foraminal stenosis. Tiny focal area of T2 hyperintensity
in the cervical spinal cord of the level of C3-4 likely reflecting
mild myelomalacia secondary to disc disease.
2. Cervical spine spondylosis as described above.

## 2017-08-03 DIAGNOSIS — I471 Supraventricular tachycardia: Secondary | ICD-10-CM | POA: Insufficient documentation

## 2017-10-12 IMAGING — CR DG CERVICAL SPINE 2 OR 3 VIEWS
2 series · 2 of 2 positions shown · non-contrast
Comparison: Cervical spine MRI dated September 20, 2016.

CLINICAL DATA: C3-C4 ACDF.

EXAM:
CERVICAL SPINE - 2-3 VIEW

[xtable lateral (1 of 2)]
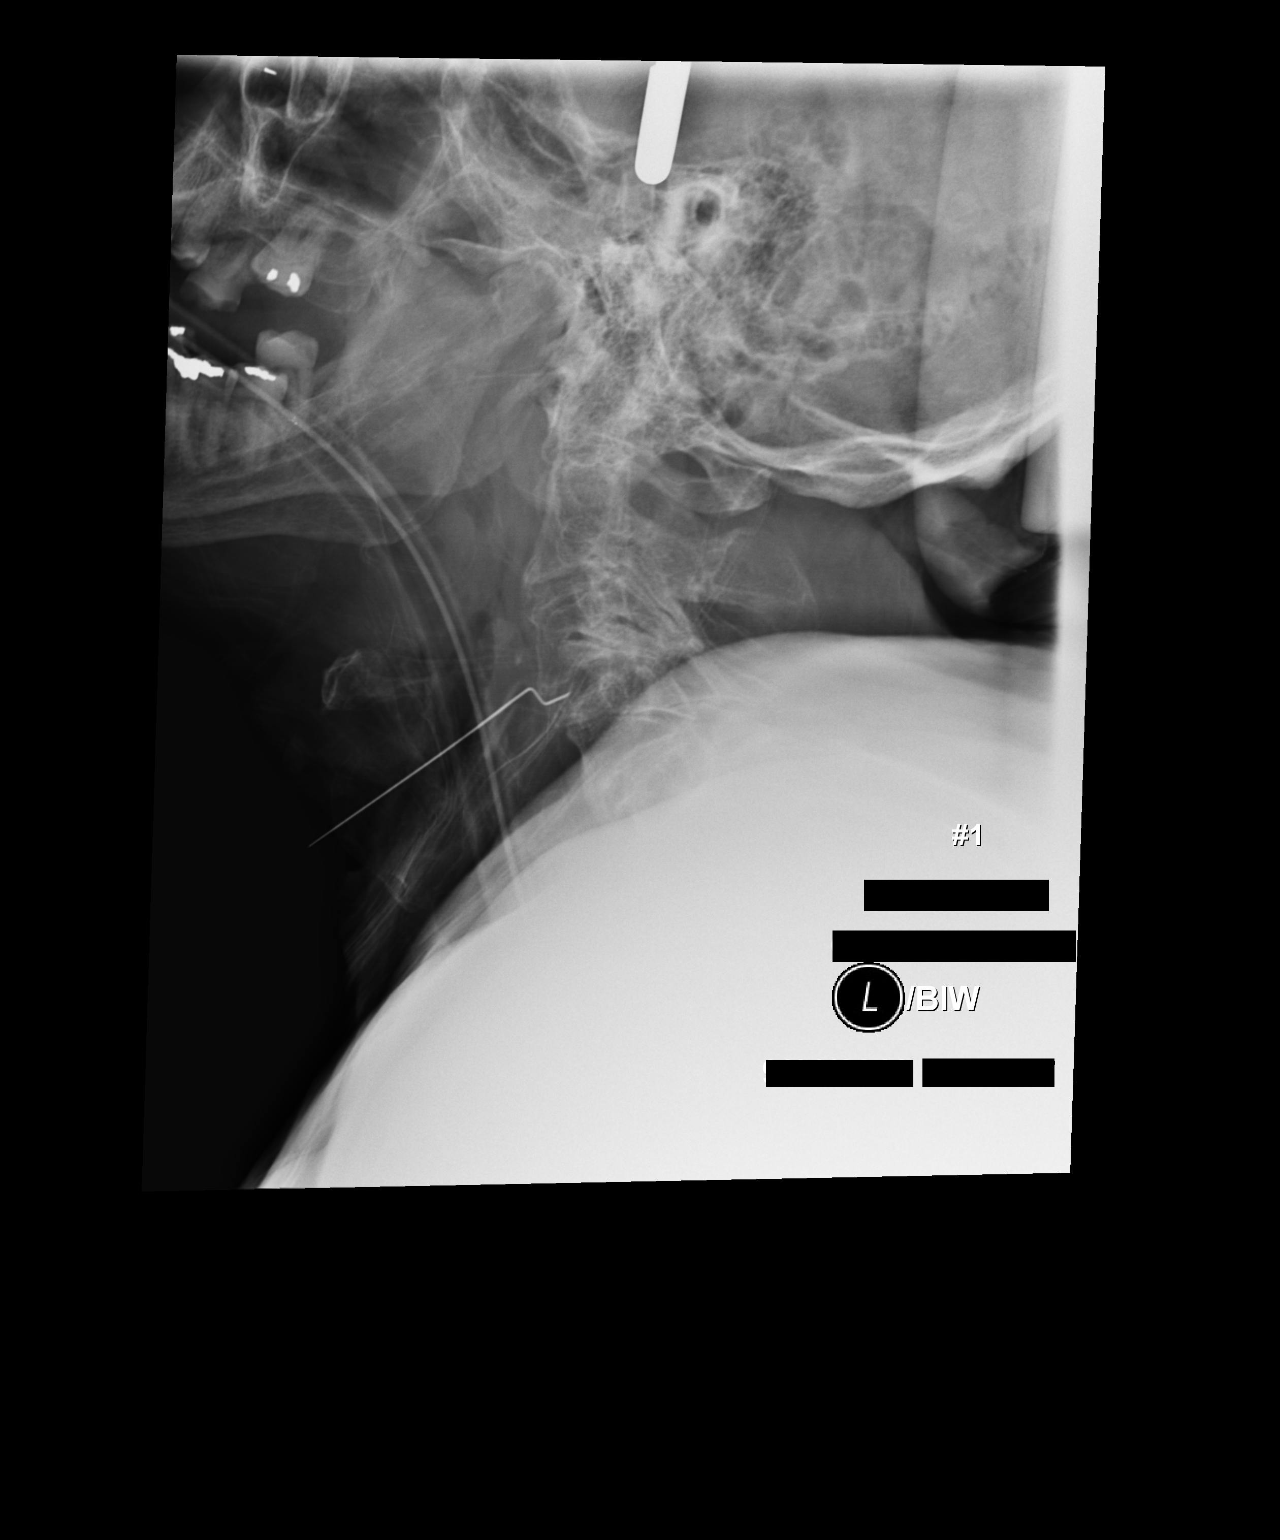

[xtable lateral (2 of 2)]
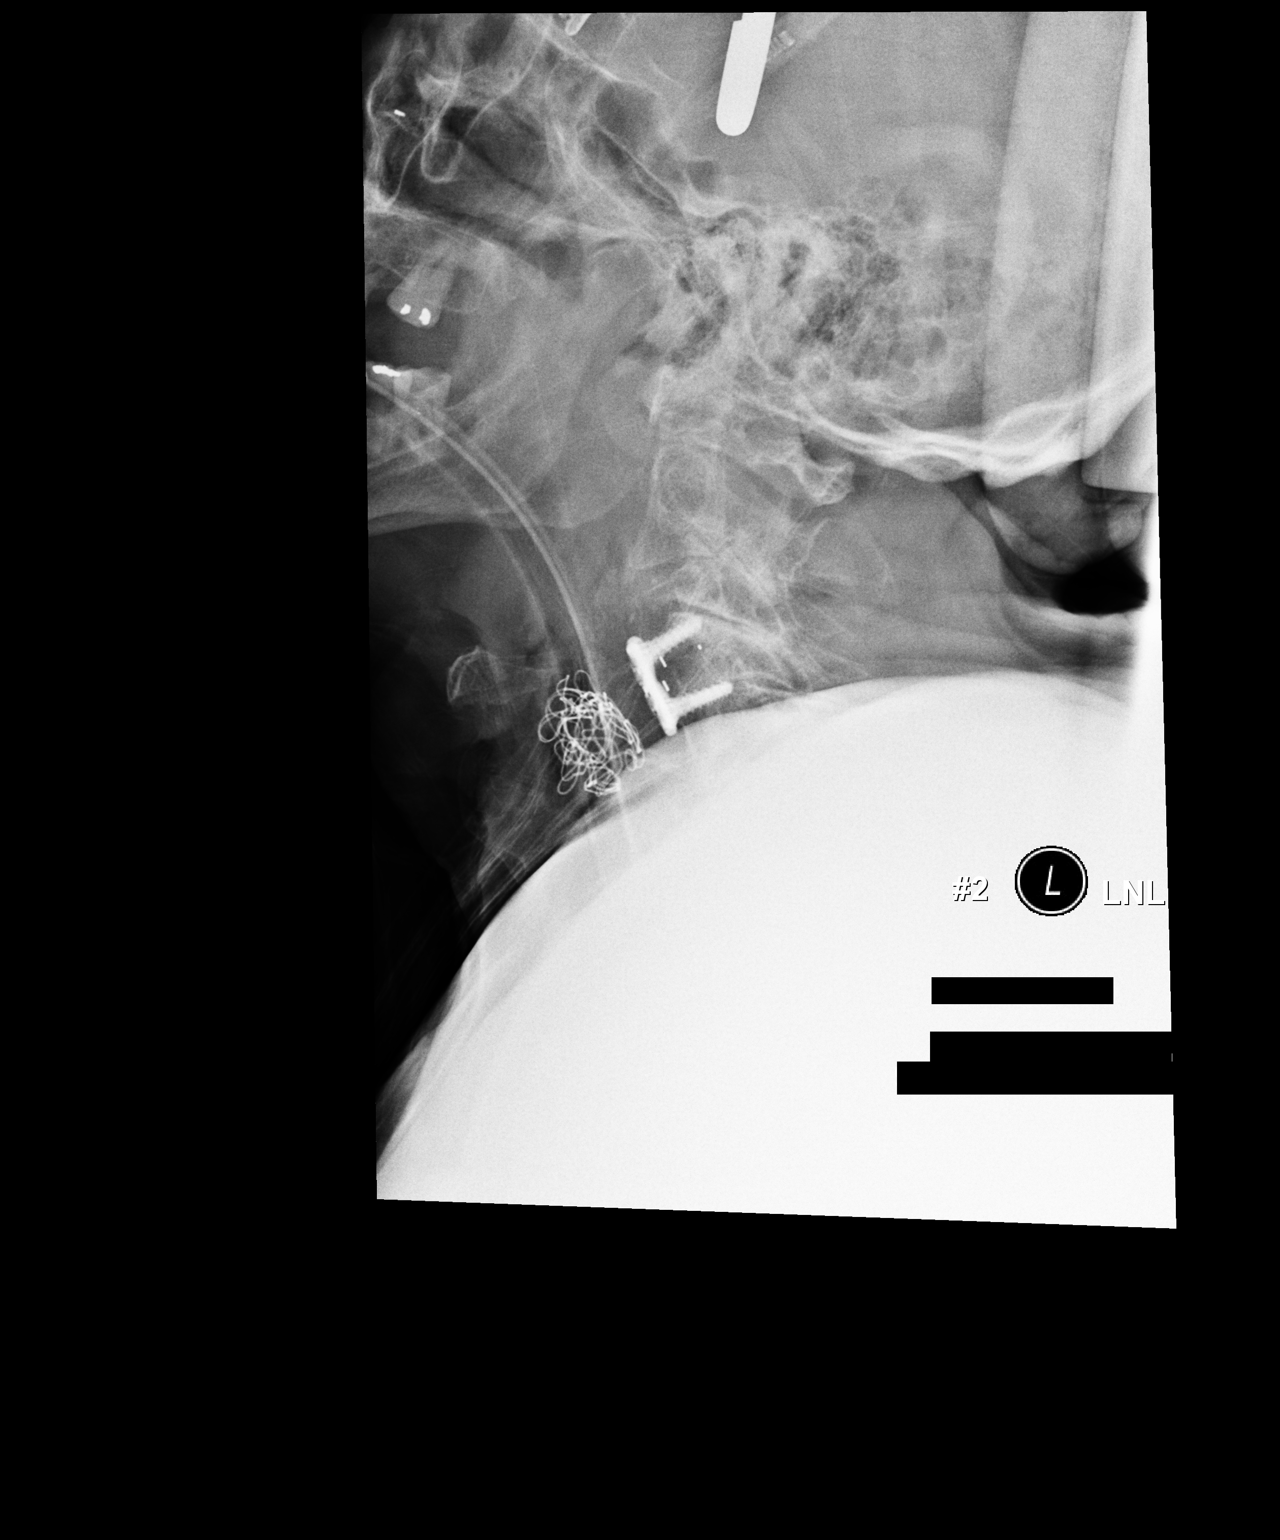

[2 of 2 positions shown; findings below may reference images not displayed]

FINDINGS: Sequential intraoperative lateral views demonstrates surgical
instrumentation at the level of C3-C4 disc space followed by C3-C4
ACDF. The bones are diffusely osteopenic.
IMPRESSION: Interval C3-C4 ACDF.

FLUOROSCOPY TIME:  C-arm fluoroscopic images were obtained
intraoperatively and submitted for post operative interpretation.
Please see the performing provider's procedural report for the
fluoroscopy time utilized.

## 2017-11-19 ENCOUNTER — Other Ambulatory Visit: Payer: Self-pay | Admitting: Neurology

## 2017-12-28 NOTE — Progress Notes (Signed)
Colin LandauBobby J Barker was seen today in the movement disorders clinic for neurologic consultation at the request of Colin Barker, William, MD.  The consultation is for the evaluation of tremor.  This patient is accompanied in the office by his wife who supplements the history.   Tremor: Yes.     How long has it been going on? 2 years, getting worse  At rest or with activation?  With use  Fam hx of tremor?  Yes.  , sisters (2) and brother have tremor  Located where?  Started in the L hand but is L hand dominant but has in the right now  Affected by caffeine:  No. (diet coke - 3 cans/day; unsweet tea with meal - 2 glasses)  Affected by alcohol:  No. (3-4 beers/week; used to be heavy drinker but stopped that 3-4 years ago)  Affected by stress:  No.  Affected by fatigue:  Yes.    Spills soup if on spoon:  yes  Spills glass of liquid if full:  Yes.    Affects ADL's (tying shoes, brushing teeth, etc):  Yes.   (has to brush teeth and use razor with both hands   Specific Symptoms:  Voice: no change Sleep: some intermittent trouble staying asleep  Vivid Dreams:  No.  Acting out dreams:  No. Wet Pillows: No. Postural symptoms:  Yes.  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  No. (none in recent years) Bradykinesia symptoms: difficulty getting out of a chair (has to push off) Loss of smell:  Yes.   Loss of taste:  No. Urinary Incontinence:  No. Difficulty Swallowing:  No. Handwriting, micrographia: No. Depression:  No. Memory changes:  No. visual distortions: Yes.   but rarely N/V:  No. Lightheaded:  No.  Syncope: No. Diplopia:  No. Dyskinesia:  No.  09/17/16 update:  Patient seen today in follow-up.  He is accompanied by his wife who supplements the history.  I ordered an MRI of the brain and cervical spine since our last visit.  They were initially denied and had to have a written appeal.   MRI of the cervical spine is subsequently scheduled for 09/20/2016 and MRI of the brain is still being appealed (currently still denied by insurance).  DaT scan was done on 09/15/2016.  This demonstrated normal uptake of the radiotracer.  I personally reviewed the films.    12/29/16 update: Patient seen today in follow-up for essential tremor.  He was started on primidone last visit.  His wife called in July and stated that the medication was helping, but not enough.  The medication was increased to 50 mg, twice daily dosing.  The medication is helpful but he still has trouble shaving.  He has cut out alcohol and has backed down on coke.  He had an MRI of the cervical spine since our last visit.  I ordered this and reviewed it when completed.  There was disc protrusion at the C 3 C4 level with associated cord myelomalacia.  He is scheduled to have cervical spine surgery on 01/05/2017 with Dr. Venetia MaxonStern.  04/29/16 update: Patient seen today in follow-up for his essential tremor.  His primidone was increased last visit so that he is taking 100 mg in the morning and 50 mg at night.  He states that he is doing really good this AM.  When he approximates an object  he will have tremor on the L.  If BS are low, he will have more tremor.  He had cervical spine surgery on January 05, 2017.  He states that he did really well and has no neck pain.  12/29/17 update: Patient is seen today in follow-up for essential tremor.  This patient is accompanied in the office by his spouse who supplements the history.  He is on primidone, 100 mg in the morning and 50 mg at night.  He reports that tremor has been stable unless shaving.  He thinks that the medication is helpful.  Wife thinks an increase in med could be helpful.  Records are reviewed since last visit.  In early July he saw his cardiologist for atrial tachycardia, which is treated with sotalol.  He is doing better with the addition of sotalol.    PREVIOUS MEDICATIONS:  metoprolol 75 mg started one year ago but no help with tremor.  Started on it by cardiology but for tremor per patient  ALLERGIES:   Allergies  Allergen Reactions  . Rosuvastatin     Other reaction(s): MUSCLE PAIN    CURRENT MEDICATIONS:  Outpatient Encounter Medications as of 12/29/2017  Medication Sig  . acetaminophen (TYLENOL) 500 MG tablet Take 1,000 mg by mouth every 6 (six) hours as needed for mild pain.   Marland Kitchen aspirin EC 81 MG tablet Take 81 mg by mouth at bedtime.   . cetirizine (ZYRTEC) 10 MG tablet Take 10 mg by mouth daily.  . cholecalciferol (VITAMIN D) 1000 units tablet Take 2,000 Units by mouth at bedtime.   . cyanocobalamin (,VITAMIN B-12,) 1000 MCG/ML injection Inject 1,000 mcg into the skin every 30 (thirty) days.  . isosorbide mononitrate (IMDUR) 30 MG 24 hr tablet Take 30 mg by mouth daily.  Marland Kitchen levothyroxine (SYNTHROID, LEVOTHROID) 100 MCG tablet Take 50-100 mcg by mouth daily before breakfast. Takes daily Mon-Fri and daily on Sat and Sun only.  Marland Kitchen omeprazole (PRILOSEC) 40 MG capsule Take 40 mg by mouth daily before breakfast.  . primidone (MYSOLINE) 50 MG tablet TAKE 2 TABLETS BY MOUTH IN THE MORNING, AND 1 TABLET IN THE EVENING  . simvastatin (ZOCOR) 40 MG tablet Take 40 mg by mouth at bedtime.   Marland Kitchen SOTALOL AF 80 MG TABS Take 1 tablet by mouth 2 (two) times daily.  . tamsulosin (FLOMAX) 0.4 MG CAPS capsule TAKE 1 CAPSULE EVERY EVENING AFTER SUPPER   No facility-administered encounter medications on file as of 12/29/2017.     PAST MEDICAL HISTORY:   Past Medical History:  Diagnosis Date  . BPH (benign prostatic hyperplasia)   . Chronic ischemic heart disease   . Coronary artery disease   . Hyperlipidemia   . Hypothyroidism   . Osteoarthritis   . Osteoarthritis   . Osteoporosis   . Syncope   . Vitamin B12 deficiency    on injections  . Vitamin D deficiency     PAST SURGICAL HISTORY:   Past Surgical History:  Procedure Laterality Date  . ANTERIOR  CERVICAL DECOMP/DISCECTOMY FUSION N/A 01/05/2017   Procedure: C3-4 Anterior cervical decompression/discectomy/fusion;  Surgeon: Maeola Harman, MD;  Location: [facility] OR;  Service: Neurosurgery;  Laterality: N/A;  C3-4 Anterior cervical decompression/discectomy/fusion  . CARDIAC CATHETERIZATION    . CATARACT EXTRACTION, BILATERAL    . LEG SURGERY Right    x3  . TONSILLECTOMY    . WISDOM TOOTH EXTRACTION      SOCIAL HISTORY:   Social History   Socioeconomic History  .  Marital status: Married    Spouse name: Not on file  . Number of children: Not on file  . Years of education: Not on file  . Highest education level: Not on file  Occupational History  . Occupation: retired    Comment: Stage manager  Social Needs  . Financial resource strain: Not on file  . Food insecurity:    Worry: Not on file    Inability: Not on file  . Transportation needs:    Medical: Not on file    Non-medical: Not on file  Tobacco Use  . Smoking status: Never Smoker  . Smokeless tobacco: Current User    Types: Chew  Substance and Sexual Activity  . Alcohol use: No  . Drug use: No  . Sexual activity: Not on file  Lifestyle  . Physical activity:    Days per week: Not on file    Minutes per session: Not on file  . Stress: Not on file  Relationships  . Social connections:    Talks on phone: Not on file    Gets together: Not on file    Attends religious service: Not on file    Active member of club or organization: Not on file    Attends meetings of clubs or organizations: Not on file    Relationship status: Not on file  . Intimate partner violence:    Fear of current or ex partner: Not on file    Emotionally abused: Not on file    Physically abused: Not on file    Forced sexual activity: Not on file  Other Topics Concern  . Not on file  Social History Narrative  . Not on file    FAMILY HISTORY:   Family Status  Relation Name Status  . Mother   Deceased  . Father  Deceased  . Sister  Alive  . Brother  Alive  . Child 2 Alive  . Sister  Deceased    ROS:   Review of Systems  Constitutional: Negative.   HENT: Negative.   Eyes: Negative.   Respiratory: Negative.   Cardiovascular: Negative.   Gastrointestinal: Negative.   Genitourinary: Negative.   Musculoskeletal: Negative.   Skin: Negative.    GEN:  The patient appears stated age and is in NAD. HEENT:  Normocephalic, atraumatic.  The mucous membranes are moist. The superficial temporal arteries are without ropiness or tenderness. CV: brady.  regular Lungs:  CTAB Neck/HEME:  There are no carotid bruits bilaterally.  Neurological examination:  Orientation: The patient is alert and oriented x3. Cranial nerves: There is good facial symmetry. The speech is fluent and clear. Soft palate rises symmetrically and there is no tongue deviation. Hearing is intact to conversational tone. Sensation: Sensation is intact to light touch throughout Motor: Strength is 5/5 in the bilateral upper and lower extremities.   Shoulder shrug is equal and symmetric.  There is no pronator drift.  Movement examination: Tone: There is normal tone in the UE/LE Abnormal movements: no tremor of outstretched hands.  No intention tremor.  Archimedes spirals look good Coordination:  There is no decremation with RAM's, with any form of RAMS, including alternating supination and pronation of the forearm, hand opening and closing, finger taps, heel taps and toe taps. Gait and Station: The patient's stride length is normal.    Labs: Patient had lab work on 06/30/2016.  I reviewed those.  His white blood cells were 5.6, hemoglobin 13.2, hematocrit 39.2 and  platelets 175.  Sodium was 142, potassium 4.3, chloride 103, CO2 21, BUN 20, creatinine 1.04 and glucose 100.  AST 18, ALT 14 and alkaline phosphatase 57.  TSH was 2.770.  ASSESSMENT/PLAN:  1.  Tremor  -DaT scan was negative but he still has some rigidity  in the RUE.    -I think that he looks well but wife thinks that med could be increased.  Will try to increase primidone 100 mg bid.  Risks, benefits, side effects and alternative therapies were discussed.  The opportunity to ask questions was given and they were answered to the best of my ability.  The patient expressed understanding and willingness to follow the outlined treatment protocols.  -Patient feels that d/c metoprolol actually made tremor better  2.  cervical spinal stenosis with cord myelomalacia  -Patient is status post surgical intervention on January 05, 2017 with Dr. Venetia Maxon.  He is much stronger and feeling much better.  3. Follow up is anticipated in the next 6 months, sooner should new neurologic issues arise.     Cc:  Colin Ducking, MD

## 2017-12-29 ENCOUNTER — Encounter: Payer: Self-pay | Admitting: Neurology

## 2017-12-29 ENCOUNTER — Ambulatory Visit: Payer: Medicare HMO | Admitting: Neurology

## 2017-12-29 VITALS — BP 140/70 | HR 54 | Ht 72.0 in | Wt 235.0 lb

## 2017-12-29 DIAGNOSIS — G250 Essential tremor: Secondary | ICD-10-CM

## 2017-12-29 NOTE — Patient Instructions (Signed)
Increase primidone to 50 mg - 2 tablets twice per day

## 2018-02-08 ENCOUNTER — Telehealth: Payer: Self-pay | Admitting: Neurology

## 2018-02-08 MED ORDER — PRIMIDONE 50 MG PO TABS: 100.0000 mg | ORAL_TABLET | Freq: Two times a day (BID) | ORAL | 1 refills | Status: DC

## 2018-02-08 NOTE — Telephone Encounter (Signed)
Patient's wife called and he is needing a refill on his Primidone medication. Patient uses [facility] in [place]. He has ran out sooner due to the increased dosage. Thanks

## 2018-02-08 NOTE — Telephone Encounter (Signed)
RX sent to pharmacy with new directions.

## 2018-05-30 NOTE — Progress Notes (Signed)
Colin Barker was seen today in the movement disorders clinic for neurologic consultation at the request of No primary care provider on file..  The consultation is for the evaluation of tremor.  This patient is accompanied in the office by his wife who supplements the history.   Tremor: Yes.     How long has it been going on? 2 years, getting worse  At rest or with activation?  With use  Fam hx of tremor?  Yes.  , sisters (2) and brother have tremor  Located where?  Started in the L hand but is L hand dominant but has in the right now  Affected by caffeine:  No. (diet coke - 3 cans/day; unsweet tea with meal - 2 glasses)  Affected by alcohol:  No. (3-4 beers/week; used to be heavy drinker but stopped that 3-4 years ago)  Affected by stress:  No.  Affected by fatigue:  Yes.    Spills soup if on spoon:  yes  Spills glass of liquid if full:  Yes.    Affects ADL's (tying shoes, brushing teeth, etc):  Yes.   (has to brush teeth and use razor with both hands   Specific Symptoms:  Voice: no change Sleep: some intermittent trouble staying asleep  Vivid Dreams:  No.  Acting out dreams:  No. Wet Pillows: No. Postural symptoms:  Yes.  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  No. (none in recent years) Bradykinesia symptoms: difficulty getting out of a chair (has to push off) Loss of smell:  Yes.   Loss of taste:  No. Urinary Incontinence:  No. Difficulty Swallowing:  No. Handwriting, micrographia: No. Depression:  No. Memory changes:  No. visual distortions: Yes.   but rarely N/V:  No. Lightheaded:  No.  Syncope: No. Diplopia:  No. Dyskinesia:  No.  09/17/16 update:  Patient seen today in follow-up.  He is accompanied by his wife who supplements the history.  I ordered an MRI of the brain and cervical spine since our last visit.  They were initially denied and had to have a  written appeal.  MRI of the cervical spine is subsequently scheduled for 09/20/2016 and MRI of the brain is still being appealed (currently still denied by insurance).  DaT scan was done on 09/15/2016.  This demonstrated normal uptake of the radiotracer.  I personally reviewed the films.    12/29/16 update: Patient seen today in follow-up for essential tremor.  He was started on primidone last visit.  His wife called in July and stated that the medication was helping, but not enough.  The medication was increased to 50 mg, twice daily dosing.  The medication is helpful but he still has trouble shaving.  He has cut out alcohol and has backed down on coke.  He had an MRI of the cervical spine since our last visit.  I ordered this and reviewed it when completed.  There was disc protrusion at the C 3 C4 level with associated cord myelomalacia.  He is scheduled to have cervical spine surgery on 01/05/2017 with Dr. Venetia Maxon.  04/29/16 update: Patient seen today in follow-up for his essential tremor.  His primidone was increased last visit so that he is taking 100 mg in the morning and 50 mg at night.  He states that he is doing really good this AM.  When he  approximates an object he will have tremor on the L.  If BS are low, he will have more tremor.  He had cervical spine surgery on January 05, 2017.  He states that he did really well and has no neck pain.  12/29/17 update: Patient is seen today in follow-up for essential tremor.  This patient is accompanied in the office by his spouse who supplements the history.  He is on primidone, 100 mg in the morning and 50 mg at night.  He reports that tremor has been stable unless shaving.  He thinks that the medication is helpful.  Wife thinks an increase in med could be helpful.  Records are reviewed since last visit.  In early July he saw his cardiologist for atrial tachycardia, which is treated with sotalol.  He is doing better with the addition of sotalol.    05/31/18 update:  Patient is seen today in follow-up for essential tremor, accompanied by his wife who supplements the history.  Patient is on primidone, 100 mg twice per day (this was an increase after our last visit).  Patient reports that the increase helped.  He has some trouble with shaving (using blade).  No new medical problems.  PREVIOUS MEDICATIONS: metoprolol 75 mg started one year ago but no help with tremor.  Started on it by cardiology but for tremor per patient  ALLERGIES:   Allergies  Allergen Reactions  . Rosuvastatin     Other reaction(s): MUSCLE PAIN    CURRENT MEDICATIONS:  Outpatient Encounter Medications as of 05/31/2018  Medication Sig  . acetaminophen (TYLENOL) 500 MG tablet Take 1,000 mg by mouth every 6 (six) hours as needed for mild pain.   . aspirin EC 81 MG tablet Take 81 mg by mouth at bedtime.   . cetirizine (ZYRTEC) 10 MG tablet Take 10 mg by mouth daily.  . cholecalciferol (VITAMIN D) 1000 units tablet Take 2,000 Units by mouth at bedtime.   . cyanocobalamin (,VITAMIN B-12,) 1000 MCG/ML injection Inject 1,000 mcg into the skin every 30 (thirty) days.  . isosorbide mononitrate (IMDUR) 30 MG 24 hr tablet Take 30 mg by mouth daily.  . levothyroxine (SYNTHROID, LEVOTHROID) 100 MCG tablet Take 50-100 mcg by mouth daily before breakfast. Takes <MEASUREMEN47.Alcario DroFair [phone]145a43 1[facility] 47.Alcario DroWesthaven-[phone]143a8 Ole1[place] 47.Alcario [phone] Olegario SBlu1Blue Mo 47.Alcario [phone] POlegario SBlueLiVergie 47.Alcario DroOcean [phone]317a9 Olegario SBl1[facility] 47.Alcario [phone] SBlueLiVergi1[place] Me 47.Alcario DroPe2560017[facility]280a36 GrandroOlegario SBlu1Ca 47.Alcario [id number] AirOlegario S1[facility] 47.Alcario [phone]894a47 KinOlegario SBlueLiVergie 47.Alcario DroO'[phone] South Tar1Ms [place] 47.Alcario DroGovernment [phone] S. Green Olegario SBlueLiVergie 47.Alcario [phone] ElizOlegario SBlu1[place] Su 47.Alcario DroMi-Wuk [phone]178a27 HanovOlegario SBlu1[place] W1[place] Spe 47.Alcario [phone]350a69 LafayeOlegari1[place] 47.Alcario [phone]967a821 FOlegario SBlueLiVergie 47.Alcario DroNew Hartford [phone]278a3 Rock 1[facility] 47.Alcario [phone]033a6 Indian SOlegario SBl1[facility]<MEASUREMENPleasant [phone] W1St 47.Alcario DroJasmine [phone]645a375 Birch Olegario S1[facility] [place] [facility] 47.Alcario [phone]698a3 BuckinghOlegario S1[place] 47.Alcario DroBear [phone]571a26 E. OaOle1Ch1[place] 47.Alcario DroEl [id number] East Olegario SBlue1[facility] 47.Alcario [phone]974a125 ChOlegar1[facility] 47.Alcario [phone]279a60 W. ManhatOl1[facility] 47.Alcario [phone]505a858 WilOlegario SBlueLiVergie 47.Alcario DroRiver [phone]304a709 North Olegario SBlueLiVergie 47.Alcario DroGlacier [phone]3641[facility] 47.Alcario DroSan [phone]575a326O1Surgery Cente 47.Alcario DroWinslow [phone]0BlueLiVergie 47.Alcario [phone]617a735 Beaver ROlegario SBlue1[place] 47.Alcario [phone]321a637 SE.1[facility]en 47.Alcario [phone] East SmOlegario SBlueLiVergie LiviM1[place] 47.Alcario DroUnion [phone] StonyOleg1[facility] OutpaMerlyn Marland KitchenLot8Cherly Andersonie LiviMariel CraftKitchenLotLincol [phone] RiversidKor efore breakfast.  . primidone (MYSOLINE) 50 MG tablet Take 2 tablets (100 mg total) by mouth 2 (two) times daily.  . simvastatin (ZOCOR) 40 MG tablet Take 40 mg by mouth at bedtime.   . SOTALOL AF 80 MG TABS Take 1 tablet by mouth 2 (two) times daily.  . tamsulosin (FLOMAX) 0.4 MG CAPS capsule TAKE 1 CAPSULE EVERY EVENING AFTER SUPPER   No facility-administered encounter medications on file as of 05/31/2018.     PAST MEDICAL HISTORY:   Past Medical History:  Diagnosis Date  . BPH (benign prostatic hyperplasia)   .  Chronic ischemic heart disease   . Coronary artery disease   . Hyperlipidemia   . Hypothyroidism   . Osteoarthritis   . Osteoarthritis   . Osteoporosis   . Syncope   . Vitamin B12 deficiency    on injections  . Vitamin D deficiency     PAST SURGICAL HISTORY:   Past Surgical History:  Procedure Laterality Date  . ANTERIOR CERVICAL DECOMP/DISCECTOMY FUSION N/A 01/05/2017   Procedure: C3-4 Anterior cervical decompression/discectomy/fusion;  Surgeon: Stern, Joseph, MD;  Location:  [facility] OR;  Service: Neurosurgery;  Laterality: N/A;  C3-4 Anterior cervical decompression/discectomy/fusion  . CARDIAC CATHETERIZATION    . CATARACT EXTRACTION, BILATERAL    . LEG SURGERY Right    x3  . TONSILLECTOMY    . WISDOM TOOTH EXTRACTION      SOCIAL HISTORY:   Social History   Socioeconomic History  . Marital status: Married    Spouse name: Not on file  . Number of children: Not on file  . Years of education: Not on file  . Highest education level: Not on file  Occupational History  . Occupation: retired    Comment: Stage manager  Social Needs  . Financial resource strain: Not on file  . Food insecurity:    Worry: Not on file    Inability: Not on file  . Transportation needs:    Medical: Not on file    Non-medical: Not on file  Tobacco Use  . Smoking status: Never Smoker  . Smokeless tobacco: Current User    Types: Chew  Substance and Sexual Activity  . Alcohol use: No  . Drug use: No  . Sexual activity: Not on file  Lifestyle  . Physical activity:    Days per week: Not on file    Minutes per session: Not on file  . Stress: Not on file  Relationships  . Social connections:    Talks on phone: Not on file    Gets together: Not on file    Attends religious service: Not on file    Active member of club or organization: Not on file    Attends meetings of clubs or organizations: Not on file    Relationship status: Not on file  . Intimate  partner violence:    Fear of current or ex partner: Not on file    Emotionally abused: Not on file    Physically abused: Not on file    Forced sexual activity: Not on file  Other Topics Concern  . Not on file  Social History Narrative  . Not on file    FAMILY HISTORY:   Family Status  Relation Name Status  . Mother  Deceased  . Father  Deceased  . Sister  Alive  . Brother  Alive  . Child 2 Alive  . Sister  Deceased    ROS:   Review of Systems  Constitutional: Positive for weight loss (Trying).  HENT: Negative.   Eyes: Negative.   Respiratory: Negative.   Cardiovascular: Negative.   Gastrointestinal: Negative.   Genitourinary: Negative.   Musculoskeletal: Negative.   Skin: Negative.    GEN:  The patient appears stated age and is in NAD. HEENT:  Normocephalic, atraumatic.  The mucous membranes are moist. The superficial temporal arteries are without ropiness or tenderness. CV:  RRR Lungs:  CTAB Neck/HEME:  There are no carotid bruits bilaterally.  Neurological examination:  Orientation: The patient is alert and oriented x3. Cranial nerves: There is good facial symmetry. The speech is fluent and clear. Soft palate rises symmetrically and there is no tongue deviation. Hearing is intact to conversational tone. Sensation: Sensation is intact to light touch throughout Motor: Strength is 5/5 in the bilateral upper and lower extremities.   Shoulder shrug is equal and symmetric.  There is no pronator drift.  Movement examination: Tone: There is normal tone in the UE/LE Abnormal movements: mild tremor in the UE.  No significant tremor when given a weight Coordination:  There is  no decremation with RAM's, with any form of RAMS, including alternating supination and pronation of the forearm, hand opening and closing, finger taps, heel taps and toe taps. Gait and Station: The patient has no difficulty arising out of a deep-seated chair without the use of the hands. The patient's  stride length is normal.       Labs: Patient had lab work on 06/30/2016.  I reviewed those.  His white blood cells were 5.6, hemoglobin 13.2, hematocrit 39.2 and platelets 175.  Sodium was 142, potassium 4.3, chloride 103, CO2 21, BUN 20, creatinine 1.04 and glucose 100.  AST 18, ALT 14 and alkaline phosphatase 57.  TSH was 2.770.  ASSESSMENT/PLAN:  1.  Tremor  -DaT scan was negative   -Continue primidone, 100 mg twice per day.  Will let me know when he needs a refill.  -pt is due for labs at [facility] in May, 2020.  We will try to get a copy of those before next visit.    2.  cervical spinal stenosis with cord myelomalacia  -Patient is status post surgical intervention on January 05, 2017 with Dr. Venetia MaxonStern.  He is much stronger and feeling much better.  3.  Follow-up in 6 to 9 months, sooner should new neurologic issues arise.     Cc:  No primary care provider on file.

## 2018-05-31 ENCOUNTER — Encounter: Payer: Self-pay | Admitting: Neurology

## 2018-05-31 ENCOUNTER — Ambulatory Visit: Payer: Medicare HMO | Admitting: Neurology

## 2018-05-31 VITALS — BP 126/68 | HR 50 | Ht 72.0 in | Wt 234.0 lb

## 2018-05-31 DIAGNOSIS — G250 Essential tremor: Secondary | ICD-10-CM

## 2018-10-11 ENCOUNTER — Other Ambulatory Visit: Payer: Self-pay | Admitting: Neurology

## 2018-10-11 NOTE — Telephone Encounter (Signed)
Rx last filled:02/08/18 #360 1 refills  Pt last seen:05/31/18 ASSESSMENT/PLAN:  1.  Tremor             -DaT scan was negative              -Continue primidone, 100 mg twice per day.  Will let me know when he needs a refill.  Follow up appt scheduled:01/30/19  Requested Prescriptions   Pending Prescriptions Disp Refills  . primidone (MYSOLINE) 50 MG tablet [Pharmacy Med Name: PRIMIDONE 50 MG TABLET] 360 tablet 1    Sig: TAKE 2 TABLETS (100 MG TOTAL) BY MOUTH 2 (TWO) TIMES DAILY.

## 2019-01-26 NOTE — Progress Notes (Addendum)
Colin LandauBobby J Barker was seen today in the movement disorders clinic for neurologic consultation at the request of Colin Barker, William, MD.  The consultation is for the evaluation of tremor.  This patient is accompanied in the office by his wife who supplements the history.   Tremor: Yes.     How long has it been going on? 2 years, getting worse  At rest or with activation?  With use  Fam hx of tremor?  Yes.  , sisters (2) and brother have tremor  Located where?  Started in the L hand but is L hand dominant but has in the right now  Affected by caffeine:  No. (diet coke - 3 cans/day; unsweet tea with meal - 2 glasses)  Affected by alcohol:  No. (3-4 beers/week; used to be heavy drinker but stopped that 3-4 years ago)  Affected by stress:  No.  Affected by fatigue:  Yes.    Spills soup if on spoon:  yes  Spills glass of liquid if full:  Yes.    Affects ADL's (tying shoes, brushing teeth, etc):  Yes.   (has to brush teeth and use razor with both hands   Specific Symptoms:  Voice: no change Sleep: some intermittent trouble staying asleep  Vivid Dreams:  No.  Acting out dreams:  No. Wet Pillows: No. Postural symptoms:  Yes.  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  No. (none in recent years) Bradykinesia symptoms: difficulty getting out of a chair (has to push off) Loss of smell:  Yes.   Loss of taste:  No. Urinary Incontinence:  No. Difficulty Swallowing:  No. Handwriting, micrographia: No. Depression:  No. Memory changes:  No. visual distortions: Yes.   but rarely N/V:  No. Lightheaded:  No.  Syncope: No. Diplopia:  No. Dyskinesia:  No.  09/17/16 update:  Patient seen today in follow-up.  He is accompanied by his wife who supplements the history.  I ordered an MRI of the brain and cervical spine since our last visit.  They were initially denied and had to have a written appeal.   MRI of the cervical spine is subsequently scheduled for 09/20/2016 and MRI of the brain is still being appealed (currently still denied by insurance).  DaT scan was done on 09/15/2016.  This demonstrated normal uptake of the radiotracer.  I personally reviewed the films.    12/29/16 update: Patient seen today in follow-up for essential tremor.  He was started on primidone last visit.  His wife called in July and stated that the medication was helping, but not enough.  The medication was increased to 50 mg, twice daily dosing.  The medication is helpful but he still has trouble shaving.  He has cut out alcohol and has backed down on coke.  He had an MRI of the cervical spine since our last visit.  I ordered this and reviewed it when completed.  There was disc protrusion at the C 3 C4 level with associated cord myelomalacia.  He is scheduled to have cervical spine surgery on 01/05/2017 with Dr. Venetia MaxonStern.  04/29/16 update: Patient seen today in follow-up for his essential tremor.  His primidone was increased last visit so that he is taking 100 mg in the morning and 50 mg at night.  He states that he is doing really good this AM.  When he approximates an object  he will have tremor on the L.  If BS are low, he will have more tremor.  He had cervical spine surgery on January 05, 2017.  He states that he did really well and has no neck pain.  12/29/17 update: Patient is seen today in follow-up for essential tremor.  This patient is accompanied in the office by his spouse who supplements the history.  He is on primidone, 100 mg in the morning and 50 mg at night.  He reports that tremor has been stable unless shaving.  He thinks that the medication is helpful.  Wife thinks an increase in med could be helpful.  Records are reviewed since last visit.  In early July he saw his cardiologist for atrial tachycardia, which is treated with sotalol.  He is doing better with the addition of sotalol.    05/31/18 update: Patient is seen  today in follow-up for essential tremor, accompanied by his wife who supplements the history.  Patient is on primidone, 100 mg twice per day (this was an increase after our last visit).  Patient reports that the increase helped.  He has some trouble with shaving (using blade).  No new medical problems.  01/30/19 update: Patient seen today in follow-up for essential tremor.  Patient is on primidone, 100 mg twice per day.  Patient reports that "its not bad when my arms are out but it does when I get things close to my face."  Thinks that med is helping.  Can drink without spilling water.  Does not think he needs an increased dose.  Asks about abnormal muscle movement in the mm of the R arm.  No falls.    PREVIOUS MEDICATIONS: metoprolol 75 mg started one year ago but no help with tremor.  Started on it by cardiology but for tremor per patient  ALLERGIES:   Allergies  Allergen Reactions  . Rosuvastatin     Other reaction(s): MUSCLE PAIN    CURRENT MEDICATIONS:  Outpatient Encounter Medications as of 01/30/2019  Medication Sig  . acetaminophen (TYLENOL) 500 MG tablet Take 1,000 mg by mouth every 6 (six) hours as needed for mild pain.   Marland Kitchen aspirin EC 81 MG tablet Take 81 mg by mouth at bedtime.   . cetirizine (ZYRTEC) 10 MG tablet Take 10 mg by mouth daily.  . cholecalciferol (VITAMIN D) 1000 units tablet Take 2,000 Units by mouth at bedtime.   . clopidogrel (PLAVIX) 75 MG tablet Take 75 mg by mouth daily.  . cyanocobalamin (,VITAMIN B-12,) 1000 MCG/ML injection Inject 1,000 mcg into the skin every 30 (thirty) days.  . isosorbide mononitrate (IMDUR) 30 MG 24 hr tablet Take 30 mg by mouth daily.  Marland Kitchen levothyroxine (SYNTHROID, LEVOTHROID) 100 MCG tablet Take 50-100 mcg by mouth daily before breakfast. Takes daily Mon-Fri and daily on Sat and Sun only.  Marland Kitchen omeprazole (PRILOSEC) 40 MG capsule Take 40 mg by mouth daily before breakfast.  . primidone (MYSOLINE) 50 MG tablet TAKE 2 TABLETS (100  MG TOTAL) BY MOUTH 2 (TWO) TIMES DAILY.  . simvastatin (ZOCOR) 40 MG tablet Take 40 mg by mouth at bedtime.   Marland Kitchen SOTALOL AF 80 MG TABS Take 1 tablet by mouth 2 (two) times daily.  . tamsulosin (FLOMAX) 0.4 MG CAPS capsule TAKE 1 CAPSULE EVERY EVENING AFTER SUPPER  . sulfamethoxazole-trimethoprim (BACTRIM DS) 800-160 MG tablet Take 1 tablet by mouth 2 (two) times daily.   No facility-administered encounter medications on file as of 01/30/2019.  PAST MEDICAL HISTORY:   Past Medical History:  Diagnosis Date  . BPH (benign prostatic hyperplasia)   . Chronic ischemic heart disease   . Coronary artery disease   . Hyperlipidemia   . Hypothyroidism   . Osteoarthritis   . Osteoarthritis   . Osteoporosis   . Syncope   . Vitamin B12 deficiency    on injections  . Vitamin D deficiency     PAST SURGICAL HISTORY:   Past Surgical History:  Procedure Laterality Date  . ANTERIOR CERVICAL DECOMP/DISCECTOMY FUSION N/A 01/05/2017   Procedure: C3-4 Anterior cervical decompression/discectomy/fusion;  Surgeon: Erline Levine, MD;  Location: [facility];  Service: Neurosurgery;  Laterality: N/A;  C3-4 Anterior cervical decompression/discectomy/fusion  . CARDIAC CATHETERIZATION    . CATARACT EXTRACTION, BILATERAL    . LEG SURGERY Right    x3  . TONSILLECTOMY    . WISDOM TOOTH EXTRACTION      SOCIAL HISTORY:   Social History   Socioeconomic History  . Marital status: Married    Spouse name: Not on file  . Number of children: 2  . Years of education: Not on file  . Highest education level: Some college, no degree  Occupational History  . 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 Financial resource strain: Not on file  . Food insecurity    Worry: Not on file    Inability: Not on file  . Transportation needs    Medical: Not on file    Non-medical: Not on file  Tobacco Use  . Smoking status: Never Smoker  . Smokeless tobacco:  Current User    Types: Chew  Substance and Sexual Activity  . Alcohol use: No  . Drug use: No  . Sexual activity: Not on file  Lifestyle  . Physical activity    Days per week: Not on file    Minutes per session: Not on file  . Stress: Not on file  Relationships  . Social Herbalist on phone: Not on file    Gets together: Not on file    Attends religious service: Not on file    Active member of club or organization: Not on file    Attends meetings of clubs or organizations: Not on file    Relationship status: Not on file  . Intimate partner violence    Fear of current or ex partner: Not on file    Emotionally abused: Not on file    Physically abused: Not on file    Forced sexual activity: Not on file  Other Topics Concern  . Not on file  Social History Narrative  . Not on file    FAMILY HISTORY:   Family Status  Relation Name Status  . Mother  Deceased  . Father  Deceased  . Sister  Alive  . Brother  Alive  . Child 2 Alive  . Sister  Deceased    ROS:   Review of Systems  Constitutional: Negative.   HENT: Negative.   Eyes: Negative.   Respiratory: Negative.   Cardiovascular: Negative.   Gastrointestinal: Negative.   Genitourinary: Negative.   Skin: Negative.    GEN:  The patient appears stated age and is in NAD. HEENT:  Normocephalic, atraumatic.  The mucous membranes are moist. The superficial temporal arteries are without ropiness or tenderness. CV:  RRR Lungs:  CTAB Neck/HEME:  There are no carotid bruits bilaterally.  Patient's shirt was  removed for examination of trunk  Neurological examination:  Orientation: The patient is alert and oriented x3. Cranial nerves: There is good facial symmetry. The speech is fluent and clear. Soft palate rises symmetrically and there is no tongue deviation. Hearing is intact to conversational tone. Sensation: Sensation is intact to light touch throughout Motor: Strength is 5/5 in the bilateral upper and lower  extremities.   Shoulder shrug is equal and symmetric.  There is no pronator drift.  Movement examination: Tone: There is normal tone in the UE/LE Abnormal movements: mild tremor in the UE only when given a weight.  There is just a single fasciculation noted in the right tricep muscle.  I did not see other fasciculations, including in the arms, legs, trunk or tongue. Coordination:  There is no decremation with RAM's, with any form of RAMS, including alternating supination and pronation of the forearm, hand opening and closing, finger taps, heel taps and toe taps. Gait and Station: The patient has no difficulty arising out of a deep-seated chair without the use of the hands. The patient's stride length is normal.       Addendum labs: Lab work is received from primary care physician and was dated January 30, 2019.  White blood cells were 7.1, hemoglobin 12.1, hematocrit 36.6 and platelets 175.  Sodium was 141, potassium 4.2, chloride 106, CO2 23, BUN 23, creatinine 1.43, glucose 86, total cholesterol 137, LDL 76, TSH 1.650.  ASSESSMENT/PLAN:  1.  Tremor  -DaT scan was negative   -Continue primidone, 100 mg twice per day.  Will let me know when he needs a refill.  -get copy of labs done today   2.  cervical spinal stenosis with cord myelomalacia  -Patient is status post surgical intervention on January 05, 2017 with Dr. Venetia Maxon.  He is much stronger and feeling much better.  3.  Fasciculation on R deltoid  -no other fasciculations noted, including in tongue, trunk, legs, but patient would like to investigate etiology.  -we will do EMG but suspect that this is radicular.  Has neck pain only when driving  -Discussed with patient that I do not think that this is neurodegenerative.  Generally, fasciculations are identified by "the company to keep" and I have not seen anything otherwise worrisome on his exam.  4.  Follow-up in 6 months, sooner should new neurologic issues arise.  Much greater than  50% of this visit was spent in counseling and coordinating care.  Total face to face time:  25 min     Cc:  Colin Ducking, MD

## 2019-01-30 ENCOUNTER — Encounter: Payer: Self-pay | Admitting: Neurology

## 2019-01-30 ENCOUNTER — Other Ambulatory Visit: Payer: Self-pay

## 2019-01-30 ENCOUNTER — Ambulatory Visit: Payer: Medicare HMO | Admitting: Neurology

## 2019-01-30 VITALS — BP 118/60 | HR 52 | Wt 230.1 lb

## 2019-01-30 DIAGNOSIS — G250 Essential tremor: Secondary | ICD-10-CM | POA: Diagnosis not present

## 2019-01-30 DIAGNOSIS — R253 Fasciculation: Secondary | ICD-10-CM | POA: Diagnosis not present

## 2019-02-22 ENCOUNTER — Ambulatory Visit: Payer: Medicare HMO | Admitting: Neurology

## 2019-02-22 ENCOUNTER — Other Ambulatory Visit: Payer: Self-pay

## 2019-02-22 ENCOUNTER — Telehealth: Payer: Self-pay | Admitting: *Deleted

## 2019-02-22 DIAGNOSIS — G5601 Carpal tunnel syndrome, right upper limb: Secondary | ICD-10-CM

## 2019-02-22 DIAGNOSIS — R253 Fasciculation: Secondary | ICD-10-CM | POA: Diagnosis not present

## 2019-02-22 NOTE — Telephone Encounter (Signed)
Called and informed patient per Dr. Carles Collet that besides right carpal tunnel syndrome the EMG testing looked good.   He stated that the carpal tunnel diagnosis is new for him and he wanted to know what if any next steps he needs to take (next appt not until 07/31/19).   Informed patient I will pass this on to MD and our office will contact him back tomorrow - verbalizes understanding.

## 2019-02-22 NOTE — Telephone Encounter (Signed)
Called patient and made him aware of MD's recommendations below. He said it is not bothering him now. Aware of the wrist splint if needed in the future. No further questions.

## 2019-02-22 NOTE — Telephone Encounter (Signed)
If not bothersome, then no.  He could certainly get a wrist splint and wear it on the right hand at least nightly.  We can write a RX for him if he wants to get fitted for it.  If he wants it, write a written/printed script for cock up wrist splint - right and then we can mail to him or he can pick it up

## 2019-02-22 NOTE — Procedures (Signed)
[facility]  [place], [place]  [place], [place] [address] Tel: [phone] Fax:  [phone] Test Date:  02/22/2019  Patient: Colin Barker DOB: [birth year] Physician: Narda Amber, DO  Sex: Male Height: 6\' 0"  Ref Phys: Alonza Bogus, DO  ID#: [id number] Temp: 32.0C Technician:    Patient Complaints: This is a 78 year old man with history of cervical spinal stenosis s/p decompression referred for evaluation of right arm fasciculations.  NCV & EMG Findings: Extensive electrodiagnostic testing of the right upper extremity shows:  1. Right median sensory response shows prolonged latency (4.4 ms).  Right ulnar sensory responses within normal limits. 2. Right median motor response shows prolonged latency (4.4 ms).  Right ulnar motor responses within normal limits.   3. Chronic motor axonal loss changes are seen affecting the right abductor pollicis brevis muscle, without accompanied active denervation.  Specifically, no fasciculation potentials are seen in any of the tested muscles.   Impression: 1. Right median neuropathy at or distal to the wrist (moderate), consistent with a clinical diagnosis of carpal tunnel syndrome.   2. There is no evidence of a widespread disorder of anterior horn cells or cervical radiculopathy.   ___________________________ Narda Amber, DO    Nerve Conduction Studies Anti Sensory Summary Table   Site NR Peak (ms) Norm Peak (ms) P-T Amp (V) Norm P-T Amp  Right Median Anti Sensory (2nd Digit)  32C  Wrist    4.4 <3.8 19.3 >10  Right Ulnar Anti Sensory (5th Digit)  32C  Wrist    3.2 <3.2 14.8 >5   Motor Summary Table   Site NR Onset (ms) Norm Onset (ms) O-P Amp (mV) Norm O-P Amp Site1 Site2 Delta-0 (ms) Dist (cm) Vel (m/s) Norm Vel (m/s)  Right Median Motor (Abd Poll Brev)  32C  Wrist    4.4 <4.0 6.0 >5 Elbow Wrist 6.1 32.0 52 >50  Elbow    10.5  5.2         Right Ulnar Motor (Abd Dig Minimi)  32C  Wrist    2.9 <3.1 8.1 >7 B  Elbow Wrist 4.7 24.0 51 >50  B Elbow    7.6  7.4  A Elbow B Elbow 1.7 10.0 59 >50  A Elbow    9.3  7.0          EMG   Side Muscle Ins Act Fibs Psw Fasc Number Recrt Dur Dur. Amp Amp. Poly Poly. Comment  Right 1stDorInt Nml Nml Nml Nml Nml Nml Nml Nml Nml Nml Nml Nml N/A  Right PronatorTeres Nml Nml Nml Nml Nml Nml Nml Nml Nml Nml Nml Nml N/A  Right Biceps Nml Nml Nml Nml Nml Nml Nml Nml Nml Nml Nml Nml N/A  Right Abd Poll Brev Nml Nml Nml Nml 1- Rapid Some 1+ Some 1+ Nml Nml N/A  Right Triceps Nml Nml Nml Nml Nml Nml Nml Nml Nml Nml Nml Nml N/A  Right Deltoid Nml Nml Nml Nml Nml Nml Nml Nml Nml Nml Nml Nml N/A      Waveforms:

## 2019-02-22 NOTE — Telephone Encounter (Signed)
-----   Message from [place], DO sent at 02/22/2019 11:55 AM EDT ----- Let pt know that besides for Right carpal tunnel syndrome, the EMG testing looked good

## 2019-04-10 ENCOUNTER — Other Ambulatory Visit: Payer: Self-pay | Admitting: Neurology

## 2019-07-27 NOTE — Progress Notes (Signed)
Assessment/Plan:    1.  Essential Tremor  increase primidone, 150 in the AM and continue 100 mg at night  -Has had DaTscan in the past that was negative.  2.  History of cervical spine stenosis with cord myelomalacia  -Status post surgery with Dr. Vertell Limber  3.  Moderate right carpal tunnel syndrome  -Discussed various treatments, including cock-up wrist splints, injections, surgical interventions.  Patient wanted no treatment at this time.  Subjective:   Colin Barker was seen today in follow up for essential tremor. He reports that it is much worse when trying to get food to the mouth or if tired.   My previous records were reviewed prior to todays visit as well as other records made available to me. Pt denies falls.  Pt denies lightheadedness, near syncope.  No hallucinations.  Mood has been good.  Because of fasciculations last visit in the right deltoid muscles, the patient did have EMG completed.  This is done on October 28th by Dr. Posey Pronto.  I reviewed that.  The only thing that was noted was moderate carpal tunnel syndrome on the right.  She did not see any fasciculation potentials in any other muscles.  Patient saw cardiology on January 11.  His history of atrial tachycardia.  He did mention considering cutting sotalol in half the next visit because of ongoing bradycardia.  Current prescribed movement disorder medications: Primidone, 100 mg twice per day    ALLERGIES:   Allergies  Allergen Reactions  . Rosuvastatin     Other reaction(s): MUSCLE PAIN    CURRENT MEDICATIONS:  Outpatient Encounter Medications as of 07/31/2019  Medication Sig  . acetaminophen (TYLENOL) 500 MG tablet Take 1,000 mg by mouth every 6 (six) hours as needed for mild pain.   Marland Kitchen aspirin EC 81 MG tablet Take 81 mg by mouth at bedtime.   . cetirizine (ZYRTEC) 10 MG tablet Take 10 mg by mouth daily.  . cholecalciferol (VITAMIN D) 1000 units tablet Take 2,000 Units by mouth at bedtime.   . clopidogrel  (PLAVIX) 75 MG tablet Take 75 mg by mouth daily.  . cyanocobalamin (,VITAMIN B-12,) 1000 MCG/ML injection Inject 1,000 mcg into the skin every 30 (thirty) days.  . isosorbide mononitrate (IMDUR) 30 MG 24 hr tablet Take 30 mg by mouth daily.  Marland Kitchen levothyroxine (SYNTHROID, LEVOTHROID) 100 MCG tablet Take 50-100 mcg by mouth daily before breakfast. Takes 56mcg daily Mon-Fri and 154mcg daily on Sat and Sun only.  Marland Kitchen omeprazole (PRILOSEC) 40 MG capsule Take 40 mg by mouth daily before breakfast.  . primidone (MYSOLINE) 50 MG tablet 3 in the AM, 2 at night  . simvastatin (ZOCOR) 40 MG tablet Take 40 mg by mouth at bedtime.   Marland Kitchen SOTALOL AF 80 MG TABS Take 1 tablet by mouth 2 (two) times daily.  Marland Kitchen sulfamethoxazole-trimethoprim (BACTRIM DS) 800-160 MG tablet Take 1 tablet by mouth 2 (two) times daily.  . tamsulosin (FLOMAX) 0.4 MG CAPS capsule TAKE 1 CAPSULE EVERY EVENING AFTER SUPPER  . [DISCONTINUED] primidone (MYSOLINE) 50 MG tablet TAKE 2 TABLETS (100 MG TOTAL) BY MOUTH 2 (TWO) TIMES DAILY.   No facility-administered encounter medications on file as of 07/31/2019.     Objective:    PHYSICAL EXAMINATION:    VITALS:   Vitals:   07/31/19 1104  BP: (!) 148/71  Pulse: (!) 51  SpO2: 98%  Weight: 232 lb 9.6 oz (105.5 kg)  Height: 6' (1.829 m)    GEN:  The  patient appears stated age and is in NAD. HEENT:  Normocephalic, atraumatic.  The mucous membranes are moist. The superficial temporal arteries are without ropiness or tenderness. CV:  Huston Foley.  regular Lungs:  CTAB Neck/HEME:  There are no carotid bruits bilaterally.  Neurological examination:  Orientation: The patient is alert and oriented x3. Cranial nerves: There is good facial symmetry. The speech is fluent and clear. Soft palate rises symmetrically and there is no tongue deviation. Hearing is intact to conversational tone. Sensation: Sensation is intact to light touch throughout Motor: Strength is at least antigravity x4.  Movement  examination: Tone: There is normal tone in the UE/LE Abnormal movements: rare rest tremor on the right when writing with the L hand (L handed).  Mild tremor with Archimedes spirals.  No significant tremor with holding a weight, either with holding it in the proximal or distal position. Coordination:  There is no decremation with RAM's Gait and Station: The patient has no difficulty arising out of a deep-seated chair without the use of the hands. The patient's stride length is good  I have reviewed and interpreted the following labs independently Lab work is received from primary care physician and was dated January 30, 2019.  White blood cells were 7.1, hemoglobin 12.1, hematocrit 36.6 and platelets 175.  Sodium was 141, potassium 4.2, chloride 106, CO2 23, BUN 23, creatinine 1.43, glucose 86, total cholesterol 137, LDL 76, TSH 1.650.  Total time spent on today's visit was 20 minutes, including both face-to-face time and nonface-to-face time.  Time included that spent on review of records (prior notes available to me/labs/imaging if pertinent), discussing treatment and goals, answering patient's questions and coordinating care.  Cc:  Nicolasa Ducking, MD/u

## 2019-07-31 ENCOUNTER — Ambulatory Visit: Payer: Medicare HMO | Admitting: Neurology

## 2019-07-31 ENCOUNTER — Encounter: Payer: Self-pay | Admitting: Neurology

## 2019-07-31 ENCOUNTER — Other Ambulatory Visit: Payer: Self-pay

## 2019-07-31 VITALS — BP 148/71 | HR 51 | Ht 72.0 in | Wt 232.6 lb

## 2019-07-31 DIAGNOSIS — G250 Essential tremor: Secondary | ICD-10-CM | POA: Diagnosis not present

## 2019-07-31 MED ORDER — PRIMIDONE 50 MG PO TABS: ORAL_TABLET | ORAL | 1 refills | Status: DC

## 2019-07-31 NOTE — Patient Instructions (Signed)
Increase primidone, 50 mg, 3 in the AM and 2 at night.

## 2019-09-14 ENCOUNTER — Other Ambulatory Visit: Payer: Self-pay | Admitting: Neurology

## 2019-09-18 ENCOUNTER — Telehealth: Payer: Self-pay | Admitting: Neurology

## 2019-09-18 MED ORDER — PRIMIDONE 50 MG PO TABS: ORAL_TABLET | ORAL | 1 refills | Status: DC

## 2019-09-18 NOTE — Telephone Encounter (Signed)
Patient's wife called and said the recent prescription for primidone 50 MG was sent in incorrectly with old directions. She said it will need corrected to: 3 tablets in the AM, 2 tablets in the PM.  [facility] [address]

## 2019-09-18 NOTE — Telephone Encounter (Signed)
Correct rx has been sent to the pharmacy.   Pharmacy contacted to discontinue previous rx that was sent over. Voiced understanding.   Patients spouse notified and voiced understanding.

## 2020-01-11 ENCOUNTER — Other Ambulatory Visit: Payer: Self-pay | Admitting: Neurology

## 2020-01-12 NOTE — Telephone Encounter (Signed)
Rx(s) sent to pharmacy electronically.  

## 2020-01-29 NOTE — Progress Notes (Signed)
Assessment/Plan:    1.  Essential Tremor  -Continue primidone 150 in the morning and 100 mg at night  -History of normal DaTscan  2.  History of cervical spinal stenosis with cord myelomalacia  -Status post surgery with Dr. Venetia Maxon  3.  Moderate right carpal tunnel  -Patient declines treatment  Subjective:   Colin Barker was seen today in follow up for essential tremor.  My previous records were reviewed prior to todays visit.  He is doing pretty well with tremor.  Only notices it with shaving and still able to do that. Pt denies falls.  Pt denies lightheadedness, near syncope.  No hallucinations.  Mood has been good.  Current prescribed movement disorder medications: Primidone, 150 mg in the morning and 100 mg at night (slight increase last visit)  ALLERGIES:   Allergies  Allergen Reactions  . Rosuvastatin     Other reaction(s): MUSCLE PAIN    CURRENT MEDICATIONS:  Outpatient Encounter Medications as of 01/30/2020  Medication Sig  . acetaminophen (TYLENOL) 500 MG tablet Take 1,000 mg by mouth every 6 (six) hours as needed for mild pain.   Marland Kitchen aspirin EC 81 MG tablet Take 81 mg by mouth at bedtime.   . cetirizine (ZYRTEC) 10 MG tablet Take 10 mg by mouth daily.  . cholecalciferol (VITAMIN D) 1000 units tablet Take 2,000 Units by mouth at bedtime.   . clopidogrel (PLAVIX) 75 MG tablet Take 75 mg by mouth daily.  . cyanocobalamin (,VITAMIN B-12,) 1000 MCG/ML injection Inject 1,000 mcg into the skin every 30 (thirty) days.  Marland Kitchen levothyroxine (SYNTHROID, LEVOTHROID) 100 MCG tablet Take 50-100 mcg by mouth daily before breakfast. Takes daily Mon-Fri and daily on Sat and Sun only.  Marland Kitchen omeprazole (PRILOSEC) 40 MG capsule Take 40 mg by mouth daily before breakfast.  . primidone (MYSOLINE) 50 MG tablet TAKE 3 TABS IN THE MORNING AND 2 TABS IN THE EVENING.  . simvastatin (ZOCOR) 40 MG tablet Take 40 mg by mouth at bedtime.   Marland Kitchen SOTALOL AF 80 MG TABS Take 1 tablet by mouth 2  (two) times daily.  Marland Kitchen sulfamethoxazole-trimethoprim (BACTRIM DS) 800-160 MG tablet Take 1 tablet by mouth 2 (two) times daily.  . tamsulosin (FLOMAX) 0.4 MG CAPS capsule TAKE 1 CAPSULE EVERY EVENING AFTER SUPPER  . [DISCONTINUED] isosorbide mononitrate (IMDUR) 30 MG 24 hr tablet Take 30 mg by mouth daily. (Patient not taking: Reported on 01/30/2020)   No facility-administered encounter medications on file as of 01/30/2020.     Objective:    PHYSICAL EXAMINATION:    VITALS:   Vitals:   01/30/20 1102  BP: 131/65  Pulse: (!) 54  SpO2: 98%  Weight: 228 lb (103.4 kg)  Height: 6' (1.829 m)    GEN:  The patient appears stated age and is in NAD. HEENT:  Normocephalic, atraumatic.  The mucous membranes are moist. The superficial temporal arteries are without ropiness or tenderness. CV:  Huston Foley.  regular Lungs:  CTAB Neck/HEME:  There are no carotid bruits bilaterally.  Neurological examination:  Orientation: The patient is alert and oriented x3. Cranial nerves: There is good facial symmetry. The speech is fluent and clear. Soft palate rises symmetrically and there is no tongue deviation. Hearing is intact to conversational tone. Sensation: Sensation is intact to light touch throughout Motor: Strength is at least antigravity x4.  Movement examination: Tone: There is normal tone in the UE/LE Abnormal movements: no rest tremor.  No tremor of outstretched hands.  Min tremor with archimedes spirals Coordination:  There is no decremation with RAM's Gait and Station: The patient has no difficulty arising out of a deep-seated chair without the use of the hands. The patient's stride length is good I have reviewed and interpreted the following labs independently   Chemistry      Component Value Date/Time   NA 139 12/29/2016 1040   K 4.4 12/29/2016 1040   CL 107 12/29/2016 1040   CO2 24 12/29/2016 1040   BUN 22 (H) 12/29/2016 1040   CREATININE 1.14 12/29/2016 1040      Component Value  Date/Time   CALCIUM 9.4 12/29/2016 1040      Lab Results  Component Value Date   WBC 6.2 12/29/2016   HGB 13.4 12/29/2016   HCT 40.8 12/29/2016   MCV 97.6 12/29/2016   PLT 178 12/29/2016   No results found for: TSH   Chemistry      Component Value Date/Time   NA 139 12/29/2016 1040   K 4.4 12/29/2016 1040   CL 107 12/29/2016 1040   CO2 24 12/29/2016 1040   BUN 22 (H) 12/29/2016 1040   CREATININE 1.14 12/29/2016 1040      Component Value Date/Time   CALCIUM 9.4 12/29/2016 1040         Total time spent on today's visit was 20 minutes, including both face-to-face time and nonface-to-face time.  Time included that spent on review of records (prior notes available to me/labs/imaging if pertinent), discussing treatment and goals, answering patient's questions and coordinating care.  Cc:  Nicolasa Ducking, MD

## 2020-01-30 ENCOUNTER — Encounter: Payer: Self-pay | Admitting: Neurology

## 2020-01-30 ENCOUNTER — Other Ambulatory Visit: Payer: Self-pay

## 2020-01-30 ENCOUNTER — Ambulatory Visit: Payer: Medicare HMO | Admitting: Neurology

## 2020-01-30 VITALS — BP 131/65 | HR 54 | Ht 72.0 in | Wt 228.0 lb

## 2020-01-30 DIAGNOSIS — G250 Essential tremor: Secondary | ICD-10-CM

## 2020-01-30 NOTE — Patient Instructions (Signed)
No changes in your medication!  Continue your primidone, 50 mg, 3 in the AM, 2 at bed.  Call me when you need a refill.  The physicians and staff at [facility] are committed to providing excellent care. You may receive a survey requesting feedback about your experience at our office. We strive to receive "very good" responses to the survey questions. If you feel that your experience would prevent you from giving the office a "very good " response, please contact our office to try to remedy the situation. We may be reached at [phone]. Thank you for taking the time out of your busy day to complete the survey.

## 2020-03-29 ENCOUNTER — Other Ambulatory Visit: Payer: Self-pay | Admitting: Neurology

## 2020-03-29 NOTE — Telephone Encounter (Signed)
Rx(s) sent to pharmacy electronically.  

## 2020-10-25 NOTE — Progress Notes (Signed)
Assessment/Plan:    1.  Essential Tremor  -pt tremor looked good today but report tremor worse at home.  Wants to try and increase primidone, 50 mg, 4 tablets in the morning and 2 tablets at night  -History of normal DaTscan  2.  History of cervical spinal stenosis with cord myelomalacia  -Status post surgery with Dr. Venetia Maxon  3.  Moderate right carpal tunnel  -Patient declines treatment  4.  Hx chronic knee osteomyelitis  -discussed that I don't treat this (they wanted me to RX bactrim) and this would need to come from PCP.  Subjective:   Colin Barker was seen today in follow up for essential tremor.  My previous records were reviewed prior to todays visit.  Pt with wife who supplements hx.  Patient states that tremor is about the same - wife states that it is not good esp when he eats.  Cardiology notes from April are reviewed.  Patient was doing well at the time.  Separately, they state that PCP has retired and was RX bacrim for prn use for chronic osteo.  They ask if I will prescribe   Current prescribed movement disorder medications: Primidone, 150 mg in the morning and 100 mg at night   ALLERGIES:   Allergies  Allergen Reactions   Rosuvastatin     Other reaction(s): MUSCLE PAIN    CURRENT MEDICATIONS:  Outpatient Encounter Medications as of 10/29/2020  Medication Sig   acetaminophen (TYLENOL) 500 MG tablet Take 1,000 mg by mouth every 6 (six) hours as needed for mild pain.    aspirin EC 81 MG tablet Take 81 mg by mouth at bedtime.    cetirizine (ZYRTEC) 10 MG tablet Take 10 mg by mouth daily.   cholecalciferol (VITAMIN D) 1000 units tablet Take 2,000 Units by mouth at bedtime.    clopidogrel (PLAVIX) 75 MG tablet Take 75 mg by mouth daily.   cyanocobalamin (,VITAMIN B-12,) 1000 MCG/ML injection Inject 1,000 mcg into the skin every 30 (thirty) days.   levothyroxine (SYNTHROID, LEVOTHROID) 100 MCG tablet Take 50-100 mcg by mouth daily before breakfast. Takes  daily Mon-Fri and daily on Sat and Sun only.   omeprazole (PRILOSEC) 40 MG capsule Take 40 mg by mouth daily before breakfast.   primidone (MYSOLINE) 50 MG tablet TAKE 3 TABS IN THE MORNING AND 2 TABS IN THE EVENING.   simvastatin (ZOCOR) 40 MG tablet Take 40 mg by mouth at bedtime.    SOTALOL AF 80 MG TABS Take 1 tablet by mouth 2 (two) times daily.   sulfamethoxazole-trimethoprim (BACTRIM DS) 800-160 MG tablet Take 1 tablet by mouth 2 (two) times daily.   tamsulosin (FLOMAX) 0.4 MG CAPS capsule TAKE 1 CAPSULE EVERY EVENING AFTER SUPPER   No facility-administered encounter medications on file as of 10/29/2020.     Objective:    PHYSICAL EXAMINATION:    VITALS:   There were no vitals filed for this visit.   GEN:  The patient appears stated age and is in NAD. HEENT:  Normocephalic, atraumatic.  The mucous membranes are moist. The superficial temporal arteries are without ropiness or tenderness. CV:  Huston Foley.  regular Lungs:  CTAB Neck/HEME:  There are no carotid bruits bilaterally.  Neurological examination:  Orientation: The patient is alert and oriented x3. Cranial nerves: There is good facial symmetry. The speech is fluent and clear. Soft palate rises symmetrically and there is no tongue deviation. Hearing is intact to conversational tone. Sensation: Sensation is  intact to light touch throughout Motor: Strength is at least antigravity x4.  Movement examination: Tone: There is normal tone in the UE/LE Abnormal movements: no rest tremor.  No tremor of outstretched hands.  Min tremor with archimedes spirals Coordination:  There is no decremation with RAM's Gait and Station: The patient has no difficulty arising out of a deep-seated chair without the use of the hands. The patient's stride length is good but he is flexed at waist I have reviewed and interpreted the following labs independently   Chemistry      Component Value Date/Time   NA 139 12/29/2016 1040   K 4.4  12/29/2016 1040   CL 107 12/29/2016 1040   CO2 24 12/29/2016 1040   BUN 22 (H) 12/29/2016 1040   CREATININE 1.14 12/29/2016 1040      Component Value Date/Time   CALCIUM 9.4 12/29/2016 1040      Lab Results  Component Value Date   WBC 6.2 12/29/2016   HGB 13.4 12/29/2016   HCT 40.8 12/29/2016   MCV 97.6 12/29/2016   PLT 178 12/29/2016   No results found for: TSH   Chemistry      Component Value Date/Time   NA 139 12/29/2016 1040   K 4.4 12/29/2016 1040   CL 107 12/29/2016 1040   CO2 24 12/29/2016 1040   BUN 22 (H) 12/29/2016 1040   CREATININE 1.14 12/29/2016 1040      Component Value Date/Time   CALCIUM 9.4 12/29/2016 1040         Total time spent on today's visit was 20 minutes, including both face-to-face time and nonface-to-face time.  Time included that spent on review of records (prior notes available to me/labs/imaging if pertinent), discussing treatment and goals, answering patient's questions and coordinating care.  Cc:  Nicolasa Ducking, MD

## 2020-10-29 ENCOUNTER — Encounter: Payer: Self-pay | Admitting: Neurology

## 2020-10-29 ENCOUNTER — Ambulatory Visit: Payer: Medicare HMO | Admitting: Neurology

## 2020-10-29 ENCOUNTER — Other Ambulatory Visit: Payer: Self-pay

## 2020-10-29 VITALS — BP 110/62 | HR 78 | Resp 18 | Ht 72.0 in | Wt 237.0 lb

## 2020-10-29 DIAGNOSIS — G250 Essential tremor: Secondary | ICD-10-CM

## 2020-10-29 MED ORDER — PRIMIDONE 50 MG PO TABS: ORAL_TABLET | ORAL | 2 refills | Status: DC

## 2020-10-29 NOTE — Patient Instructions (Addendum)
Increase primidone 50 mg, 4 in the AM, 2 at night  The physicians and staff at [facility] are committed to providing excellent care. You may receive a survey requesting feedback about your experience at our office. We strive to receive "very good" responses to the survey questions. If you feel that your experience would prevent you from giving the office a "very good " response, please contact our office to try to remedy the situation. We may be reached at [phone]. Thank you for taking the time out of your busy day to complete the survey.

## 2020-11-12 ENCOUNTER — Other Ambulatory Visit: Payer: Self-pay | Admitting: Neurology

## 2021-07-29 NOTE — Progress Notes (Signed)
? ? ?Assessment/Plan:  ? ? ?1.  Essential Tremor ? -pt tremor looked good today but report tremor worse at home.  This was about the same report as last visit.  Continue primidone, 50 mg, 4 tablets in the morning and 2 tablets at night ? -History of normal DaTscan ? -They asked about focused ultrasound.  We discussed that in detail along with DBS.  He really does not think he is interested. ? ?2.  History of cervical spinal stenosis with cord myelomalacia ? -Status post surgery with Dr. Venetia Maxon ? ?3.  Moderate right carpal tunnel ? -Patient declines treatment ? ?4.  Follow-up in about 8 to 9 months. ? ?Subjective:  ? ?Colin Barker was seen today in follow up for essential tremor.  My previous records were reviewed prior to todays visit.  Pt with wife who supplements hx. his primidone was slightly increased last visit, which was last July.  He is tolerating medication well, without side effects.  He continues to have some tremor.  He notes that tremor is worse in the late afternoon. ? ? ?Current prescribed movement disorder medications: ?Primidone, 50 mg, 4 tablets in the morning and 2 tablets at night ? ?ALLERGIES:   ?Allergies  ?Allergen Reactions  ? Rosuvastatin   ?  Other reaction(s): MUSCLE PAIN  ? ? ?CURRENT MEDICATIONS:  ?Outpatient Encounter Medications as of 07/30/2021  ?Medication Sig  ? acetaminophen (TYLENOL) 500 MG tablet Take 1,000 mg by mouth every 6 (six) hours as needed for mild pain.   ? aspirin EC 81 MG tablet Take 81 mg by mouth at bedtime.   ? cetirizine (ZYRTEC) 10 MG tablet Take 10 mg by mouth daily.  ? cholecalciferol (VITAMIN D) 1000 units tablet Take 2,000 Units by mouth at bedtime.   ? clopidogrel (PLAVIX) 75 MG tablet Take 75 mg by mouth daily.  ? cyanocobalamin (,VITAMIN B-12,) 1000 MCG/ML injection Inject 1,000 mcg into the skin every 30 (thirty) days.  ? levothyroxine (SYNTHROID, LEVOTHROID) 100 MCG tablet Take 50-100 mcg by mouth daily before breakfast. Takes daily Mon-Fri and  daily on Sat and Sun only.  ? losartan (COZAAR) 50 MG tablet Take 1 tablet by mouth daily.  ? omeprazole (PRILOSEC) 40 MG capsule Take 40 mg by mouth daily before breakfast.  ? primidone (MYSOLINE) 50 MG tablet 4 in the AM, 2 at night  ? simvastatin (ZOCOR) 40 MG tablet Take 40 mg by mouth at bedtime.   ? SOTALOL AF 80 MG TABS Take 1 tablet by mouth 2 (two) times daily.  ? sulfamethoxazole-trimethoprim (BACTRIM DS) 800-160 MG tablet Take 1 tablet by mouth 2 (two) times daily. (Patient not taking: Reported on 10/29/2020)  ? tamsulosin (FLOMAX) 0.4 MG CAPS capsule TAKE 1 CAPSULE EVERY EVENING AFTER SUPPER (Patient not taking: Reported on 10/29/2020)  ? ?No facility-administered encounter medications on file as of 07/30/2021.  ? ? ? ?Objective:  ? ? ?PHYSICAL EXAMINATION:   ? ?VITALS:   ?Vitals:  ? 07/30/21 1026  ?BP: (!) 142/63  ?Pulse: 71  ?SpO2: 99%  ?Weight: 235 lb 6.4 oz (106.8 kg)  ?Height: 6' (1.829 m)  ? ? ? ?GEN:  The patient appears stated age and is in NAD. ?HEENT:  Normocephalic, atraumatic.  The mucous membranes are moist. The superficial temporal arteries are without ropiness or tenderness. ? ? ?Neurological examination: ? ?Orientation: The patient is alert and oriented x3. ?Cranial nerves: There is good facial symmetry. The speech is fluent and clear. Soft palate  rises symmetrically and there is no tongue deviation. Hearing is intact to conversational tone. ?Sensation: Sensation is intact to light touch throughout ?Motor: Strength is at least antigravity x4. ? ?Movement examination: ?Tone: There is normal tone in the UE/LE ?Abnormal movements: no rest tremor.  No tremor of outstretched hands.  No tremor in wing beating position.  No significant tremor with Archimedes spirals.  He writes a sentence without significant trouble. ?Coordination:  There is no decremation with RAM's ?Gait and Station: The patient has no difficulty arising out of a deep-seated chair without the use of the hands. The patient's  stride length is good but he is flexed at waist ?I have reviewed and interpreted the following labs independently ?  Chemistry   ?   ?Component Value Date/Time  ? NA 139 12/29/2016 1040  ? K 4.4 12/29/2016 1040  ? CL 107 12/29/2016 1040  ? CO2 24 12/29/2016 1040  ? BUN 22 (H) 12/29/2016 1040  ? CREATININE 1.14 12/29/2016 1040  ?    ?Component Value Date/Time  ? CALCIUM 9.4 12/29/2016 1040  ?  ? ? ?Lab Results  ?Component Value Date  ? WBC 6.2 12/29/2016  ? HGB 13.4 12/29/2016  ? HCT 40.8 12/29/2016  ? MCV 97.6 12/29/2016  ? PLT 178 12/29/2016  ? ?No results found for: TSH ?  Chemistry   ?   ?Component Value Date/Time  ? NA 139 12/29/2016 1040  ? K 4.4 12/29/2016 1040  ? CL 107 12/29/2016 1040  ? CO2 24 12/29/2016 1040  ? BUN 22 (H) 12/29/2016 1040  ? CREATININE 1.14 12/29/2016 1040  ?    ?Component Value Date/Time  ? CALCIUM 9.4 12/29/2016 1040  ?  ? ? ? ? ?Cc:  Nicolasa Ducking, MD ? ?

## 2021-07-30 ENCOUNTER — Ambulatory Visit: Payer: Medicare HMO | Admitting: Neurology

## 2021-07-30 ENCOUNTER — Encounter: Payer: Self-pay | Admitting: Neurology

## 2021-07-30 VITALS — BP 142/63 | HR 71 | Ht 72.0 in | Wt 235.4 lb

## 2021-07-30 DIAGNOSIS — G250 Essential tremor: Secondary | ICD-10-CM

## 2021-07-30 NOTE — Patient Instructions (Signed)
Essential Tremor °A tremor is trembling or shaking that a person cannot control. Most tremors affect the hands or arms. Tremors can also affect the head, vocal cords, legs, and other parts of the body. Essential tremor is a tremor without a known cause. Usually, it occurs while a person is trying to perform an action. It tends to get worse gradually as a person ages. °What are the causes? °The cause of this condition is not known. °What increases the risk? °You are more likely to develop this condition if: °You have a family member with essential tremor. °You are age 40 or older. °You take certain medicines. °What are the signs or symptoms? °The main sign of a tremor is a rhythmic shaking of certain parts of your body that is uncontrolled and unintentional. You may: °Have difficulty eating with a spoon or fork. °Have difficulty writing. °Nod your head up and down or side to side. °Have a quivering voice. °The shaking may: °Get worse over time. °Come and go. °Be more noticeable on one side of your body. °Get worse due to stress, fatigue, caffeine, and extreme heat or cold. °How is this diagnosed? °This condition may be diagnosed based on: °Your symptoms and medical history. °A physical exam. °There is no single test to diagnose an essential tremor. However, your health care provider may order tests to rule out other causes of your condition. These may include: °Blood and urine tests. °Imaging studies of your brain, such as CT scan and MRI. °A test that measures involuntary muscle movement (electromyogram). °How is this treated? °Treatment for essential tremor depends on the severity of the condition. °Some tremors may go away without treatment. °Mild tremors may not need treatment if they do not affect your day-to-day life. °Severe tremors may need to be treated using one or more of the following options: °Medicines. °Lifestyle changes. °Occupational or physical therapy. °Follow these instructions at  home: °Lifestyle ° °Do not use any products that contain nicotine or tobacco, such as cigarettes and e-cigarettes. If you need help quitting, ask your health care provider. °Limit your caffeine intake as told by your health care provider. °Try to get 8 hours of sleep each night. °Find ways to manage your stress that fits your lifestyle and personality. Consider trying meditation or yoga. °Try to anticipate stressful situations and allow extra time to manage them. °If you are struggling emotionally with the effects of your tremor, consider working with a mental health provider. °General instructions °Take over-the-counter and prescription medicines only as told by your health care provider. °Avoid extreme heat and extreme cold. °Keep all follow-up visits as told by your health care provider. This is important. Visits may include physical therapy visits. °Contact a health care provider if: °You experience any changes in the location or intensity of your tremors. °You start having a tremor after starting a new medicine. °You have tremor with other symptoms, such as: °Numbness. °Tingling. °Pain. °Weakness. °Your tremor gets worse. °Your tremor interferes with your daily life. °You feel down, blue, or sad for at least 2 weeks in a row. °Worrying about your tremor and what other people think about you interferes with your everyday life functions, including relationships, work, or school. °Summary °Essential tremor is a tremor without a known cause. Usually, it occurs when you are trying to perform an action. °You are more likely to develop this condition if you have a family member with essential tremor. °The main sign of a tremor is a rhythmic shaking of   certain parts of your body that is uncontrolled and unintentional. °Treatment for essential tremor depends on the severity of the condition. °This information is not intended to replace advice given to you by your health care provider. Make sure you discuss any questions  you have with your health care provider. °Document Revised: 01/03/2020 Document Reviewed: 01/05/2020 °Elsevier Patient Education © 2022 [employer] ° °

## 2021-09-18 ENCOUNTER — Other Ambulatory Visit: Payer: Self-pay | Admitting: Neurology

## 2022-03-12 ENCOUNTER — Other Ambulatory Visit: Payer: Self-pay | Admitting: Neurology

## 2022-03-27 NOTE — Progress Notes (Unsigned)
Assessment/Plan:    1.  Essential Tremor  -pt tremor looked good today but report tremor worse at home.  This was about the same report as last visit.  Continue primidone, 50 mg, 4 tablets in the morning and 2 tablets at night  -History of normal DaTscan  -They asked about focused ultrasound.  We discussed that in detail along with DBS.  He really does not think he is interested.  2.  History of cervical spinal stenosis with cord myelomalacia  -Status post surgery with Dr. Venetia Maxon  3.  Moderate right carpal tunnel  -Patient declines treatment  4.  Follow-up in about 8 to 9 months.  Subjective:   Colin Barker was seen today in follow up for essential tremor.  My previous records were reviewed prior to todays visit.  Pt with wife who supplements hx. Still with tremor.  He isn't interested in sx.   Current prescribed movement disorder medications: Primidone, 50 mg, 4 tablets in the morning and 2 tablets at night  ALLERGIES:   Allergies  Allergen Reactions   Rosuvastatin     Other reaction(s): MUSCLE PAIN    CURRENT MEDICATIONS:  Outpatient Encounter Medications as of 03/31/2022  Medication Sig   acetaminophen (TYLENOL) 500 MG tablet Take 1,000 mg by mouth every 6 (six) hours as needed for mild pain.    aspirin EC 81 MG tablet Take 81 mg by mouth at bedtime.    cetirizine (ZYRTEC) 10 MG tablet Take 10 mg by mouth daily.   cholecalciferol (VITAMIN D) 1000 units tablet Take 2,000 Units by mouth at bedtime.    clopidogrel (PLAVIX) 75 MG tablet Take 75 mg by mouth daily.   cyanocobalamin (,VITAMIN B-12,) 1000 MCG/ML injection Inject 1,000 mcg into the skin every 30 (thirty) days.   levothyroxine (SYNTHROID, LEVOTHROID) 100 MCG tablet Take 50-100 mcg by mouth daily before breakfast. Takes daily Mon-Fri and daily on Sat and Sun only.   losartan (COZAAR) 50 MG tablet Take 1 tablet by mouth daily.   omeprazole (PRILOSEC) 40 MG capsule Take 40 mg by mouth daily before  breakfast.   primidone (MYSOLINE) 50 MG tablet TAKE 4 TABLETS IN THE AM,AND 2 TABLETS AT NIGHT   simvastatin (ZOCOR) 40 MG tablet Take 40 mg by mouth at bedtime.    SOTALOL AF 80 MG TABS Take 1 tablet by mouth 2 (two) times daily.   sulfamethoxazole-trimethoprim (BACTRIM DS) 800-160 MG tablet Take 1 tablet by mouth 2 (two) times daily. (Patient not taking: Reported on 10/29/2020)   tamsulosin (FLOMAX) 0.4 MG CAPS capsule TAKE 1 CAPSULE EVERY EVENING AFTER SUPPER (Patient not taking: Reported on 10/29/2020)   No facility-administered encounter medications on file as of 03/31/2022.     Objective:    PHYSICAL EXAMINATION:    VITALS:   There were no vitals filed for this visit.    GEN:  The patient appears stated age and is in NAD. HEENT:  Normocephalic, atraumatic.  The mucous membranes are moist. The superficial temporal arteries are without ropiness or tenderness.   Neurological examination:  Orientation: The patient is alert and oriented x3. Cranial nerves: There is good facial symmetry. The speech is fluent and clear. Soft palate rises symmetrically and there is no tongue deviation. Hearing is intact to conversational tone. Sensation: Sensation is intact to light touch throughout Motor: Strength is at least antigravity x4.  Movement examination: Tone: There is normal tone in the UE/LE Abnormal movements: no rest tremor.  No  tremor of outstretched hands.  No tremor in wing beating position.  No significant tremor with Archimedes spirals.  He writes a sentence without significant trouble. Coordination:  There is no decremation with RAM's Gait and Station: The patient has no difficulty arising out of a deep-seated chair without the use of the hands. The patient's stride length is good but he is flexed at waist I have reviewed and interpreted the following labs independently   Chemistry      Component Value Date/Time   NA 139 12/29/2016 1040   K 4.4 12/29/2016 1040   CL 107  12/29/2016 1040   CO2 24 12/29/2016 1040   BUN 22 (H) 12/29/2016 1040   CREATININE 1.14 12/29/2016 1040      Component Value Date/Time   CALCIUM 9.4 12/29/2016 1040      Lab Results  Component Value Date   WBC 6.2 12/29/2016   HGB 13.4 12/29/2016   HCT 40.8 12/29/2016   MCV 97.6 12/29/2016   PLT 178 12/29/2016   No results found for: "TSH"   Chemistry      Component Value Date/Time   NA 139 12/29/2016 1040   K 4.4 12/29/2016 1040   CL 107 12/29/2016 1040   CO2 24 12/29/2016 1040   BUN 22 (H) 12/29/2016 1040   CREATININE 1.14 12/29/2016 1040      Component Value Date/Time   CALCIUM 9.4 12/29/2016 1040     Total time spent on today's visit was *** minutes, including both face-to-face time and nonface-to-face time.  Time included that spent on review of records (prior notes available to me/labs/imaging if pertinent), discussing treatment and goals, answering patient's questions and coordinating care.    Cc:  Nicolasa Ducking, MD

## 2022-03-31 ENCOUNTER — Encounter: Payer: Self-pay | Admitting: Neurology

## 2022-03-31 ENCOUNTER — Ambulatory Visit: Payer: Medicare HMO | Admitting: Neurology

## 2022-03-31 VITALS — BP 128/86 | HR 71 | Ht 72.0 in | Wt 235.0 lb

## 2022-03-31 DIAGNOSIS — G250 Essential tremor: Secondary | ICD-10-CM | POA: Diagnosis not present

## 2022-03-31 MED ORDER — PRIMIDONE 250 MG PO TABS: 250.0000 mg | ORAL_TABLET | Freq: Two times a day (BID) | ORAL | 1 refills | Status: AC

## 2022-03-31 NOTE — Patient Instructions (Signed)
Week 1: Increase primidone to 50 mg, 4 in the AM, 3 at night  Week 2: Increase primidone to 50 mg , 4 in the AM, 4 at bed  Week 3: Increase primidone to 50 mg, 5 in the AM, 4 at bed  Week 5 and beyond: Take primidone 250 mg, 1 tablet twice per day

## 2022-04-22 ENCOUNTER — Encounter: Payer: Self-pay | Admitting: Neurology

## 2022-08-31 ENCOUNTER — Telehealth: Payer: Self-pay | Admitting: Neurology

## 2022-08-31 NOTE — Telephone Encounter (Signed)
Pt's wife called in stating the pt was at his cardiologist's office today. They are wanting to put him on Eloquis, but the pharmacist told them it could interfere with his primidone. She is wondering if he could stop the primidone until they are able to schedule him to have his heart shocked?

## 2022-09-01 NOTE — Progress Notes (Unsigned)
Virtual Visit Via Video       Consent was obtained for video visit:  {yes no:[id number]} Answered questions that patient had about telehealth interaction:  {yes no:314532} I discussed the limitations, risks, security and privacy concerns of performing an evaluation and management service by telemedicine. I also discussed with the patient that there may be a patient responsible charge related to this service. The patient expressed understanding and agreed to proceed.  Pt location: Home Physician Location: office Name of referring provider:  Nicolasa Ducking, MD I connected with Colin Barker at patients initiation/request on 09/02/2022 at  9:15 AM EDT by video enabled telemedicine application and verified that I am speaking with the correct person using two identifiers. Pt MRN:  [id number] Pt DOB:  November 17, 1940 Video Participants:  Colin Barker;  ***  Assessment/Plan:    1.  Essential Tremor  -History of normal DaTscan  -Discussed focused ultrasound again, but he really is not interested in that.  I am not sure he is going to get great control of tremor without this.  Even on high-dose primidone, he had significant tremor.  -cardiology has him on beta blocker already (sotalol) so cannot add propranolol  2.  History of cervical spinal stenosis with cord myelomalacia  -Status post surgery with Dr. Venetia Maxon  3.  Moderate right carpal tunnel  -Patient declines treatment  4.  A-fib  -Patient was just started on Eliquis.  They wanted to schedule cardioversion in 3 weeks, but I did warn them that the primidone interacts with the Eliquis and would make it less effective and it is going to take me a few weeks to get off this high dose of primidone.  I am weaning faster than I usually do, but it is still going to take me close to a month to get him off of it  Subjective:   Colin Barker was seen today in follow up for essential tremor.  My previous records were reviewed prior to todays visit.   Patient worked in today.  He saw the nurse practitioner from cardiology at [facility] yesterday.  Notes indicate that patient was in a flutter/A-fib.  He was started on Eliquis.  The medication was sent to the pharmacy and the pharmacist told him about the interaction between Eliquis and primidone.  He called me and I told him that there was certainly an interaction, but I really did not want to stop this fairly high dose of primidone cold [place].  I was happy to wean it.  I did ask the patient to call back to [facility] to let them know about the interaction, and to let them know I would not be able to get him off of primidone immediately, and also to ask if potentially Coumadin was an option since it looks like they were preparing him for ablation.  Current prescribed movement disorder medications: Primidone, 50 mg, 4 tablets in the morning and 2 tablets at night  ALLERGIES:   Allergies  Allergen Reactions   Rosuvastatin     Other reaction(s): MUSCLE PAIN    CURRENT MEDICATIONS:  Outpatient Encounter Medications as of 09/02/2022  Medication Sig   acetaminophen (TYLENOL) 500 MG tablet Take 1,000 mg by mouth every 6 (six) hours as needed for mild pain.    aspirin EC 81 MG tablet Take 81 mg by mouth at bedtime.    cetirizine (ZYRTEC) 10 MG tablet Take 10 mg by mouth daily.   cholecalciferol (VITAMIN D) 1000 units tablet Take 2,000  Units by mouth at bedtime.    clopidogrel (PLAVIX) 75 MG tablet Take 75 mg by mouth daily.   cyanocobalamin (,VITAMIN B-12,) 1000 MCG/ML injection Inject 1,000 mcg into the skin every 30 (thirty) days.   levothyroxine (SYNTHROID, LEVOTHROID) 100 MCG tablet Take 50-100 mcg by mouth daily before breakfast. Takes daily Mon-Fri and daily on Sat and Sun only.   losartan (COZAAR) 50 MG tablet Take 100 mg by mouth daily.   omeprazole (PRILOSEC) 40 MG capsule Take 40 mg by mouth daily before breakfast.   primidone (MYSOLINE) 250 MG tablet Take 1 tablet (250 mg total) by mouth  2 (two) times daily.   simvastatin (ZOCOR) 40 MG tablet Take 40 mg by mouth at bedtime.    SOTALOL AF 80 MG TABS Take 1 tablet by mouth 2 (two) times daily.   sulfamethoxazole-trimethoprim (BACTRIM DS) 800-160 MG tablet Take 1 tablet by mouth 2 (two) times daily.   tamsulosin (FLOMAX) 0.4 MG CAPS capsule    No facility-administered encounter medications on file as of 09/02/2022.     Objective:    PHYSICAL EXAMINATION:    VITALS:   There were no vitals filed for this visit.    GEN:  The patient appears stated age and is in NAD. HEENT:  Normocephalic, atraumatic.  The mucous membranes are moist. The superficial temporal arteries are without ropiness or tenderness.   Neurological examination:  Orientation: The patient is alert and oriented x3. Cranial nerves: There is good facial symmetry. The speech is fluent and clear. Soft palate rises symmetrically and there is no tongue deviation. Hearing is intact to conversational tone. Sensation: Sensation is intact to light touch throughout Motor: Strength is at least antigravity x4.  Movement examination: Tone: There is normal tone in the UE/LE Abnormal movements: no rest tremor.  No tremor of outstretched hands.  No tremor in wing beating position.  He has mild to mod tremor on the L when given a weight and in the wing beating position. Coordination:  There is no decremation with RAM's Gait and Station: The patient has no difficulty arising out of a deep-seated chair without the use of the hands. The patient's stride length is good but he is flexed at waist I have reviewed and interpreted the following labs independently   Chemistry      Component Value Date/Time   NA 139 12/29/2016 1040   K 4.4 12/29/2016 1040   CL 107 12/29/2016 1040   CO2 24 12/29/2016 1040   BUN 22 (H) 12/29/2016 1040   CREATININE 1.14 12/29/2016 1040      Component Value Date/Time   CALCIUM 9.4 12/29/2016 1040      Lab Results  Component Value Date    WBC 6.2 12/29/2016   HGB 13.4 12/29/2016   HCT 40.8 12/29/2016   MCV 97.6 12/29/2016   PLT 178 12/29/2016   No results found for: "TSH"   Chemistry      Component Value Date/Time   NA 139 12/29/2016 1040   K 4.4 12/29/2016 1040   CL 107 12/29/2016 1040   CO2 24 12/29/2016 1040   BUN 22 (H) 12/29/2016 1040   CREATININE 1.14 12/29/2016 1040      Component Value Date/Time   CALCIUM 9.4 12/29/2016 1040     Follow up Instructions      -I discussed the assessment and treatment plan with the patient. The patient was provided an opportunity to ask questions and all were answered. The patient agreed with  the plan and demonstrated an understanding of the instructions.   The patient was advised to call back or seek an in-person evaluation if the symptoms worsen or if the condition fails to improve as anticipated.    Total time spent on today's visit was ***minutes, including both face-to-face time and nonface-to-face time.  Time included that spent on review of records (prior notes available to me/labs/imaging if pertinent), discussing treatment and goals, answering patient's questions and coordinating care.   Kerin Salen, DO     Cc:  Nicolasa Ducking, MD

## 2022-09-01 NOTE — Patient Instructions (Signed)
Week 1 Take primidone, 50 mg, 3 tablets twice per day Week 2 Take primidone, 50 mg 2 tablets twice per day Week 3: Take primidone, 50 mg, 1 tablet twice per day Week 4:  Stop primidone

## 2022-09-01 NOTE — Telephone Encounter (Signed)
Called patients wife and they are calling [facility] to speak to the NP about switching to Coumadin while patient weans off of primidone. Patients wife will call back with that info. Patients wife wanting to know if there is any other options for tremor medication for this patient besides primidone

## 2022-09-01 NOTE — Telephone Encounter (Signed)
How would you like to begin weaning this patient off of the Primidone ?

## 2022-09-02 ENCOUNTER — Telehealth: Payer: Medicare HMO | Admitting: Neurology

## 2022-09-02 DIAGNOSIS — G250 Essential tremor: Secondary | ICD-10-CM | POA: Diagnosis not present

## 2022-09-02 DIAGNOSIS — I480 Paroxysmal atrial fibrillation: Secondary | ICD-10-CM

## 2022-10-01 ENCOUNTER — Ambulatory Visit: Payer: Medicare HMO | Admitting: Neurology

## 2022-10-15 ENCOUNTER — Ambulatory Visit: Payer: Medicare HMO | Admitting: Neurology

## 2022-10-27 NOTE — Progress Notes (Signed)
Assessment/Plan:    1.  Essential Tremor             -History of normal DaTscan             -Patient off of primidone due to interaction with Eliquis.             -he's not interested in surgical options             -cardiology has him on beta blocker already (sotalol) so cannot add propranolol  -at this point, I would not recommend 2nd line meds as I think that risks>benefits.  He doesn't disagree.  -shown the weighted utensils today   2.  History of cervical spinal stenosis with cord myelomalacia             -Status post surgery with Dr. Venetia Maxon   3.  Moderate right carpal tunnel             -Patient declines treatment   4.  A-fib             -Patient on Eliquis.  He is status post cardioversion October 20, 2022.   5.  F/u prn    Subjective:   Colin Barker was seen today in follow up for essential tremor.  My previous records were reviewed prior to todays visit. Pt with wife who supplements hx.   Last visit, I gave him a weaning schedule for the primidone, as cardiology wanted him off of this because of the fact they put him on Eliquis.  Patient had cardioversion completed June 25.  He notes he is shaking more off of the primidone.      ALLERGIES:   Allergies  Allergen Reactions   Rosuvastatin     Other reaction(s): MUSCLE PAIN    CURRENT MEDICATIONS:  Outpatient Encounter Medications as of 10/30/2022  Medication Sig   acetaminophen (TYLENOL) 500 MG tablet Take 1,000 mg by mouth every 6 (six) hours as needed for mild pain.    aspirin EC 81 MG tablet Take 81 mg by mouth at bedtime.    cetirizine (ZYRTEC) 10 MG tablet Take 10 mg by mouth daily.   cholecalciferol (VITAMIN D) 1000 units tablet Take 2,000 Units by mouth at bedtime.    cyanocobalamin (,VITAMIN B-12,) 1000 MCG/ML injection Inject 1,000 mcg into the skin every 30 (thirty) days.   levothyroxine (SYNTHROID, LEVOTHROID) 100 MCG tablet Take 50-100 mcg by mouth daily before breakfast. Takes daily Mon-Fri and  daily on Sat and Sun only.   omeprazole (PRILOSEC) 40 MG capsule Take 40 mg by mouth daily before breakfast.   simvastatin (ZOCOR) 40 MG tablet Take 40 mg by mouth at bedtime.    SOTALOL AF 80 MG TABS Take 1 tablet by mouth 2 (two) times daily.   sulfamethoxazole-trimethoprim (BACTRIM DS) 800-160 MG tablet Take 1 tablet by mouth 2 (two) times daily.   clopidogrel (PLAVIX) 75 MG tablet Take 75 mg by mouth daily. (Patient not taking: Reported on 10/30/2022)   ELIQUIS 5 MG TABS tablet Take 5 mg by mouth 2 (two) times daily.   losartan (COZAAR) 50 MG tablet Take 100 mg by mouth daily.   primidone (MYSOLINE) 250 MG tablet Take 1 tablet (250 mg total) by mouth 2 (two) times daily. (Patient not taking: Reported on 10/30/2022)   [DISCONTINUED] tamsulosin (FLOMAX) 0.4 MG CAPS capsule  (Patient not taking: Reported on 10/30/2022)   No facility-administered encounter medications on file as of 10/30/2022.  Objective:    PHYSICAL EXAMINATION:    VITALS:   Vitals:   10/30/22 1124  BP: 132/60  Pulse: 66  SpO2: 96%  Weight: 234 lb (106.1 kg)  Height: 6' (1.829 m)      GEN:  The patient appears stated age and is in NAD. HEENT:  Normocephalic, atraumatic.  The mucous membranes are moist. The superficial temporal arteries are without ropiness or tenderness. CV:  Huston Foley.  Regular Lungs:  CTAB  Neurological examination:  Orientation: The patient is alert and oriented x3. Cranial nerves: There is good facial symmetry. The speech is fluent and clear. Soft palate rises symmetrically and there is no tongue deviation. Hearing is intact to conversational tone. Sensation: Sensation is intact to light touch throughout Motor: Strength is at least antigravity x4.  Movement examination: Tone: There is normal tone in the UE/LE Abnormal movements: no rest tremor.  No tremor of outstretched hands.  No tremor in wing beating position.  Actually does pretty well with archimedes spirals today.     Coordination:  There is no decremation with RAM's  I have reviewed and interpreted the following labs independently   Chemistry      Component Value Date/Time   NA 139 12/29/2016 1040   K 4.4 12/29/2016 1040   CL 107 12/29/2016 1040   CO2 24 12/29/2016 1040   BUN 22 (H) 12/29/2016 1040   CREATININE 1.14 12/29/2016 1040      Component Value Date/Time   CALCIUM 9.4 12/29/2016 1040      Lab Results  Component Value Date   WBC 6.2 12/29/2016   HGB 13.4 12/29/2016   HCT 40.8 12/29/2016   MCV 97.6 12/29/2016   PLT 178 12/29/2016   No results found for: "TSH"   Chemistry      Component Value Date/Time   NA 139 12/29/2016 1040   K 4.4 12/29/2016 1040   CL 107 12/29/2016 1040   CO2 24 12/29/2016 1040   BUN 22 (H) 12/29/2016 1040   CREATININE 1.14 12/29/2016 1040      Component Value Date/Time   CALCIUM 9.4 12/29/2016 1040        Cc:  No primary care provider on file.

## 2022-10-30 ENCOUNTER — Ambulatory Visit: Payer: Medicare HMO | Admitting: Neurology

## 2022-10-30 ENCOUNTER — Encounter: Payer: Self-pay | Admitting: Neurology

## 2022-10-30 VITALS — BP 132/60 | HR 66 | Ht 72.0 in | Wt 234.0 lb

## 2022-10-30 DIAGNOSIS — G250 Essential tremor: Secondary | ICD-10-CM | POA: Diagnosis not present

## 2023-03-28 DEATH — deceased
